# Patient Record
Sex: Male | Born: 1945
Health system: Southern US, Community
[De-identification: ages and names within clinical notes are randomized; demographics above are authoritative.]

## PROBLEM LIST (undated history)

## (undated) DIAGNOSIS — H409 Unspecified glaucoma: Secondary | ICD-10-CM

## (undated) DIAGNOSIS — I2510 Atherosclerotic heart disease of native coronary artery without angina pectoris: Secondary | ICD-10-CM

## (undated) DIAGNOSIS — I10 Essential (primary) hypertension: Secondary | ICD-10-CM

## (undated) DIAGNOSIS — R001 Bradycardia, unspecified: Secondary | ICD-10-CM

## (undated) DIAGNOSIS — K219 Gastro-esophageal reflux disease without esophagitis: Secondary | ICD-10-CM

## (undated) DIAGNOSIS — J61 Pneumoconiosis due to asbestos and other mineral fibers: Secondary | ICD-10-CM

## (undated) DIAGNOSIS — E785 Hyperlipidemia, unspecified: Secondary | ICD-10-CM

## (undated) HISTORY — DX: Bradycardia, unspecified: R00.1

## (undated) HISTORY — DX: Hyperlipidemia, unspecified: E78.5

## (undated) HISTORY — DX: Atherosclerotic heart disease of native coronary artery without angina pectoris: I25.10

## (undated) HISTORY — DX: Gastro-esophageal reflux disease without esophagitis: K21.9

## (undated) HISTORY — DX: Pneumoconiosis due to asbestos and other mineral fibers: J61

---

## 1998-05-02 ENCOUNTER — Ambulatory Visit (HOSPITAL_COMMUNITY): Admission: RE | Admit: 1998-05-02 | Discharge: 1998-05-02 | Payer: Self-pay | Admitting: Gastroenterology

## 1999-02-05 ENCOUNTER — Encounter: Admission: RE | Admit: 1999-02-05 | Discharge: 1999-02-05 | Payer: Self-pay | Admitting: *Deleted

## 1999-02-05 ENCOUNTER — Encounter: Payer: Self-pay | Admitting: *Deleted

## 2001-01-12 ENCOUNTER — Encounter: Payer: Self-pay | Admitting: *Deleted

## 2001-01-12 ENCOUNTER — Encounter: Admission: RE | Admit: 2001-01-12 | Discharge: 2001-01-12 | Payer: Self-pay | Admitting: *Deleted

## 2003-10-11 ENCOUNTER — Encounter (INDEPENDENT_AMBULATORY_CARE_PROVIDER_SITE_OTHER): Payer: Self-pay | Admitting: *Deleted

## 2003-10-11 ENCOUNTER — Ambulatory Visit (HOSPITAL_COMMUNITY): Admission: RE | Admit: 2003-10-11 | Discharge: 2003-10-11 | Payer: Self-pay | Admitting: Gastroenterology

## 2007-03-13 ENCOUNTER — Encounter: Admission: RE | Admit: 2007-03-13 | Discharge: 2007-03-13 | Payer: Self-pay | Admitting: Family Medicine

## 2007-03-14 ENCOUNTER — Ambulatory Visit (HOSPITAL_COMMUNITY): Admission: RE | Admit: 2007-03-14 | Discharge: 2007-03-14 | Payer: Self-pay | Admitting: Family Medicine

## 2008-06-24 ENCOUNTER — Ambulatory Visit: Payer: Self-pay | Admitting: Internal Medicine

## 2008-06-24 DIAGNOSIS — Z77090 Contact with and (suspected) exposure to asbestos: Secondary | ICD-10-CM | POA: Insufficient documentation

## 2008-06-24 DIAGNOSIS — I10 Essential (primary) hypertension: Secondary | ICD-10-CM

## 2008-09-17 ENCOUNTER — Encounter: Payer: Self-pay | Admitting: Internal Medicine

## 2008-09-17 ENCOUNTER — Ambulatory Visit: Payer: Self-pay | Admitting: Internal Medicine

## 2009-02-21 ENCOUNTER — Telehealth (INDEPENDENT_AMBULATORY_CARE_PROVIDER_SITE_OTHER): Payer: Self-pay | Admitting: *Deleted

## 2010-08-07 NOTE — Op Note (Signed)
NAME:  Caleb Anderson, Caleb Anderson                        ACCOUNT NO.:  [id number]   MEDICAL RECORD NO.:  [id number]                   PATIENT TYPE:  AMB   LOCATION:  ENDO                                 FACILITY:  [facility]   PHYSICIAN:  Petra Kuba, M.D.                 DATE OF BIRTH:  [birth year]   DATE OF PROCEDURE:  10/11/2003  DATE OF DISCHARGE:                                 OPERATIVE REPORT   PROCEDURE:  Esophagogastroduodenoscopy.   INDICATIONS FOR PROCEDURE:  Upper tract symptoms.  Want to proceed with a  one time endoscopy to rule out any significant abnormality.   Consent was signed after risks, benefits, methods, and options were  thoroughly discussed in the past.   MEDICINES USED ADDITIONALLY:  One of Versed only.   DESCRIPTION OF PROCEDURE:  The video endoscope was inserted by direct  vision, esophagus was normal, scope passed through a small hiatal hernia  into the stomach, advanced through a normal antrum, normal pylorus into a  normal duodenal bulb and around the C loop to a normal second portion of the  duodenum.  The scope was withdrawn back to the bulb and a good look there  ruled out abnormalities in that location. The scope was withdrawn back to  the stomach and retroflexed. High in the cardia, the hiatal hernia was  confirmed. The fundus, angularis, lesser and greater curve were normal on  retroflexed visualization. Straight visualization of the stomach did not  reveal any additional findings, air and water were suctioned, scope removed.  Again a good look at the esophagus was normal.  The patient tolerated the  procedure well. There was no obvious or immediate complication.   ENDOSCOPIC DIAGNOSIS:  1. Small hiatal hernia.  2. Otherwise normal EGD.   PLAN:  Continue pump inhibitors.  May want to try to wean them and use them  every other day.  He can go back to them p.r.n.  Happy to see back p.r.n.  Might want to consider a one time abdominal ultrasound for his GI  symptoms  otherwise return care to Dr. Arvilla Market for the customary health care  maintenance to include yearly rectals and guaiacs.                                               Petra Kuba, M.D.    MEM/MEDQ  D:  10/11/2003  T:  10/11/2003  Job:  [id number]   cc:   Donia Guiles, M.D.  [address] [place]  [place] [address]  Fax: [phone]

## 2010-08-07 NOTE — Op Note (Signed)
NAME:  Caleb Anderson, Caleb Anderson                        ACCOUNT NO.:  [id number]   MEDICAL RECORD NO.:  [id number]                   PATIENT TYPE:  AMB   LOCATION:  ENDO                                 FACILITY:  [facility]   PHYSICIAN:  Petra Kuba, M.D.                 DATE OF BIRTH:  Oct 19, 1945   DATE OF PROCEDURE:  10/11/2003  DATE OF DISCHARGE:                                 OPERATIVE REPORT   PROCEDURE:  Colonoscopy with polypectomy.   INDICATIONS:  Patient with a history of colon polyps.  Due for a repeat  screening.   Consent was signed after risks, benefits, methods, options thoroughly  discussed in the office in the past.   MEDICINES USED:  Demerol 50, Versed 5.   PROCEDURE:  Rectal inspection is pertinent for external hemorrhoids, small.  Digital exam was negative.  The video regular colonoscope was inserted and  easily advanced around the colon to the cecum.  This did not require any  abdominal pressure or any position changes.  No obvious abnormality was seen  on insertion.  The cecum was identified by the appendiceal orifice and  ileocecal valve.  The scope was inserted shortways into the terminal ileum,  which was normal.  Photo documentation was obtained.  The scope was slowly  withdrawn.  On slow withdrawal through the colon, the prep was adequate.  There was some liquid stool that required washing and suction.  The cecum,  ascending, transverse, and the majority of the descending were normal.  As  the scope was withdrawn around the sigmoid, a few diverticula were seen.  There were also very tiny distal sigmoid, probably hyperplastic-appearing  polyps, which were all hot biopsied x1 and put in the first container.  In  the rectum was a small polyp, which was snared and electrocautery applied.  The polyp was suctioned through the scope and collected in the trap.  This  was put in a second container.  Anorectal pull-through and retroflexion  confirms some small hemorrhoids.   The scope was reinserted shortways up the  left side of the colon.  Air was suctioned.  Scope removed.  The patient  tolerated the procedure well.  There was no obvious immediate complication.   ENDOSCOPIC DIAGNOSES:  1. Internal/external hemorrhoids.  2. Occasional sigmoid diverticula.  3. Three tiny sigmoid, probably hyperplastic-appearing polyps, hot biopsied.  4. One small rectal polyp, snared.  5. Otherwise within normal limits to the terminal ileum.   PLAN:  Await pathology.  Probably repeat colon screening in five years.  Continue workup with the one-time EGD.                                               Petra Kuba, M.D.    MEM/MEDQ  D:  10/11/2003  T:  10/11/2003  Job:  [id number]   cc:   Donia Guiles, M.D.  [address] [place]  [place] [address]  Fax: [phone]

## 2011-10-06 ENCOUNTER — Other Ambulatory Visit: Payer: Self-pay | Admitting: Cardiology

## 2011-10-06 ENCOUNTER — Encounter: Payer: Self-pay | Admitting: *Deleted

## 2011-10-06 DIAGNOSIS — R079 Chest pain, unspecified: Secondary | ICD-10-CM

## 2011-10-27 ENCOUNTER — Ambulatory Visit (HOSPITAL_COMMUNITY)
Admission: RE | Admit: 2011-10-27 | Discharge: 2011-10-27 | Disposition: A | Discharge status: Home / Self Care | Payer: BC Managed Care – PPO | Source: Ambulatory Visit | Attending: Cardiology | Admitting: Cardiology

## 2011-10-27 ENCOUNTER — Encounter (HOSPITAL_COMMUNITY): Payer: Self-pay

## 2011-10-27 DIAGNOSIS — K449 Diaphragmatic hernia without obstruction or gangrene: Secondary | ICD-10-CM | POA: Insufficient documentation

## 2011-10-27 DIAGNOSIS — R079 Chest pain, unspecified: Secondary | ICD-10-CM | POA: Insufficient documentation

## 2011-10-27 DIAGNOSIS — J984 Other disorders of lung: Secondary | ICD-10-CM | POA: Insufficient documentation

## 2011-10-27 DIAGNOSIS — I2510 Atherosclerotic heart disease of native coronary artery without angina pectoris: Secondary | ICD-10-CM | POA: Insufficient documentation

## 2011-10-27 HISTORY — DX: Essential (primary) hypertension: I10

## 2011-10-27 HISTORY — DX: Unspecified glaucoma: H40.9

## 2011-10-27 MED ORDER — IOHEXOL 350 MG/ML SOLN
80.0000 mL | Freq: Once | INTRAVENOUS | Status: AC | PRN
  Administered 2011-10-27: 80 mL via INTRAVENOUS

## 2011-10-27 MED ORDER — NITROGLYCERIN 0.4 MG SL SUBL
SUBLINGUAL_TABLET | SUBLINGUAL | Status: AC
  Filled 2011-10-27: qty 25

## 2012-02-14 DIAGNOSIS — H409 Unspecified glaucoma: Secondary | ICD-10-CM | POA: Diagnosis not present

## 2012-02-14 DIAGNOSIS — E669 Obesity, unspecified: Secondary | ICD-10-CM | POA: Diagnosis not present

## 2012-02-14 DIAGNOSIS — Z23 Encounter for immunization: Secondary | ICD-10-CM | POA: Diagnosis not present

## 2012-02-14 DIAGNOSIS — I10 Essential (primary) hypertension: Secondary | ICD-10-CM | POA: Diagnosis not present

## 2012-02-14 DIAGNOSIS — I2510 Atherosclerotic heart disease of native coronary artery without angina pectoris: Secondary | ICD-10-CM | POA: Diagnosis not present

## 2012-02-14 DIAGNOSIS — E782 Mixed hyperlipidemia: Secondary | ICD-10-CM | POA: Diagnosis not present

## 2012-04-25 DIAGNOSIS — E782 Mixed hyperlipidemia: Secondary | ICD-10-CM | POA: Diagnosis not present

## 2012-07-24 DIAGNOSIS — Z79899 Other long term (current) drug therapy: Secondary | ICD-10-CM | POA: Diagnosis not present

## 2012-07-24 DIAGNOSIS — E782 Mixed hyperlipidemia: Secondary | ICD-10-CM | POA: Diagnosis not present

## 2012-08-15 DIAGNOSIS — H409 Unspecified glaucoma: Secondary | ICD-10-CM | POA: Diagnosis not present

## 2012-08-15 DIAGNOSIS — H4011X0 Primary open-angle glaucoma, stage unspecified: Secondary | ICD-10-CM | POA: Diagnosis not present

## 2012-08-17 DIAGNOSIS — Z87891 Personal history of nicotine dependence: Secondary | ICD-10-CM | POA: Diagnosis not present

## 2012-08-17 DIAGNOSIS — Z6832 Body mass index (BMI) 32.0-32.9, adult: Secondary | ICD-10-CM | POA: Diagnosis not present

## 2012-08-17 DIAGNOSIS — E782 Mixed hyperlipidemia: Secondary | ICD-10-CM | POA: Diagnosis not present

## 2012-08-17 DIAGNOSIS — I2510 Atherosclerotic heart disease of native coronary artery without angina pectoris: Secondary | ICD-10-CM | POA: Diagnosis not present

## 2012-08-17 DIAGNOSIS — Z23 Encounter for immunization: Secondary | ICD-10-CM | POA: Diagnosis not present

## 2012-08-17 DIAGNOSIS — I119 Hypertensive heart disease without heart failure: Secondary | ICD-10-CM | POA: Diagnosis not present

## 2012-08-17 DIAGNOSIS — E669 Obesity, unspecified: Secondary | ICD-10-CM | POA: Diagnosis not present

## 2012-08-17 DIAGNOSIS — Z0000 Encounter for general adult medical examination without abnormal findings: Secondary | ICD-10-CM | POA: Diagnosis not present

## 2012-08-30 DIAGNOSIS — I2510 Atherosclerotic heart disease of native coronary artery without angina pectoris: Secondary | ICD-10-CM | POA: Diagnosis not present

## 2012-08-30 DIAGNOSIS — E782 Mixed hyperlipidemia: Secondary | ICD-10-CM | POA: Diagnosis not present

## 2012-08-30 DIAGNOSIS — I10 Essential (primary) hypertension: Secondary | ICD-10-CM | POA: Diagnosis not present

## 2012-09-29 DIAGNOSIS — Z87891 Personal history of nicotine dependence: Secondary | ICD-10-CM | POA: Diagnosis not present

## 2013-01-10 DIAGNOSIS — Z23 Encounter for immunization: Secondary | ICD-10-CM | POA: Diagnosis not present

## 2013-02-07 DIAGNOSIS — H4011X0 Primary open-angle glaucoma, stage unspecified: Secondary | ICD-10-CM | POA: Diagnosis not present

## 2013-02-07 DIAGNOSIS — H409 Unspecified glaucoma: Secondary | ICD-10-CM | POA: Diagnosis not present

## 2013-03-21 ENCOUNTER — Other Ambulatory Visit: Payer: Self-pay

## 2013-04-11 DIAGNOSIS — H4011X0 Primary open-angle glaucoma, stage unspecified: Secondary | ICD-10-CM | POA: Diagnosis not present

## 2013-04-11 DIAGNOSIS — H409 Unspecified glaucoma: Secondary | ICD-10-CM | POA: Diagnosis not present

## 2013-05-04 ENCOUNTER — Encounter: Payer: Self-pay | Admitting: *Deleted

## 2013-05-04 DIAGNOSIS — E7800 Pure hypercholesterolemia, unspecified: Secondary | ICD-10-CM | POA: Insufficient documentation

## 2013-05-04 DIAGNOSIS — I25810 Atherosclerosis of coronary artery bypass graft(s) without angina pectoris: Secondary | ICD-10-CM

## 2013-08-08 ENCOUNTER — Encounter: Payer: Self-pay | Admitting: Cardiology

## 2013-08-21 ENCOUNTER — Ambulatory Visit (INDEPENDENT_AMBULATORY_CARE_PROVIDER_SITE_OTHER): Payer: Medicare Other | Admitting: Cardiology

## 2013-08-21 ENCOUNTER — Encounter: Payer: Self-pay | Admitting: Cardiology

## 2013-08-21 VITALS — BP 150/86 | HR 53 | Ht 66.0 in | Wt 195.0 lb

## 2013-08-21 DIAGNOSIS — I2510 Atherosclerotic heart disease of native coronary artery without angina pectoris: Secondary | ICD-10-CM | POA: Diagnosis not present

## 2013-08-21 DIAGNOSIS — I25810 Atherosclerosis of coronary artery bypass graft(s) without angina pectoris: Secondary | ICD-10-CM | POA: Diagnosis not present

## 2013-08-21 DIAGNOSIS — E7800 Pure hypercholesterolemia, unspecified: Secondary | ICD-10-CM

## 2013-08-21 DIAGNOSIS — I10 Essential (primary) hypertension: Secondary | ICD-10-CM

## 2013-08-21 LAB — BASIC METABOLIC PANEL
BUN: 22 mg/dL (ref 6–23)
CO2: 27 meq/L (ref 19–32)
Calcium: 9 mg/dL (ref 8.4–10.5)
Chloride: 103 mEq/L (ref 96–112)
Creatinine, Ser: 1.2 mg/dL (ref 0.4–1.5)
GFR: 63.05 mL/min (ref >60.00)
GLUCOSE: 108 mg/dL — AB (ref 70–99)
POTASSIUM: 4.3 meq/L (ref 3.5–5.1)
Sodium: 137 mEq/L (ref 135–145)

## 2013-08-21 LAB — ALT: ALT: 21 U/L (ref 0–53)

## 2013-08-21 LAB — LIPID PANEL
CHOL/HDL RATIO: 7
Cholesterol: 287 mg/dL — ABNORMAL HIGH (ref 0–200)
HDL: 42.5 mg/dL (ref >39.00)
LDL Cholesterol: 194 mg/dL — ABNORMAL HIGH (ref 0–99)
Triglycerides: 255 mg/dL — ABNORMAL HIGH (ref 0.0–149.0)
VLDL: 51 mg/dL — AB (ref 0.0–40.0)

## 2013-08-21 NOTE — Patient Instructions (Signed)
Your physician has requested that you have en exercise stress myoview. For further information please visit [email or web address]. Please follow instruction sheet, as given.  Your physician recommends that you return for lab work TODAY (BMET, LIPID PROFILE, ALT)  Your physician recommends that you continue on your current medications as directed. Please refer to the Current Medication list given to you today.  Your physician wants you to follow-up in: [place]. You will receive a reminder letter in the mail two months in advance. If you don't receive a letter, please call our office to schedule the follow-up appointment.

## 2013-08-21 NOTE — Progress Notes (Signed)
[address] [place], [place]  [address] Phone: [phone] Fax:  [phone]  Date:  08/21/2013   ID:  Caleb Anderson, DOB 1945/12/30, MRN [id number]  PCP:  Mayra Neer, MD  Cardiologist:  Fransico Him, MD     History of Present Illness: Caleb Anderson is a 68 y.o. male with a history of HTN, dyslipidemia and coronary artery calcifications on chest CT and no ischemia on nuclear stress test in 2013 who presents today for annual followup.  He is doing well.  He denies any chest pain, SOB, DOE, LE edema, dizziness, palpitations or syncope.  He does not get any formal exercise but walks around 1 mile daily.   Wt Readings from Last 3 Encounters:  08/21/13 195 lb (88.451 kg)  06/24/08 203 lb (92.08 kg)     Past Medical History  Diagnosis Date  . Glaucoma   . GERD (gastroesophageal reflux disease)   . Asbestosis   . Hypertension   . Hyperlipidemia   . Coronary artery disease     coronary artery calcifications by chest CT with normal nuclear stress test 2013    Current Outpatient Prescriptions  Medication Sig Dispense Refill  . aspirin 81 MG tablet Take 81 mg by mouth daily.      . Coenzyme Q10 (CO Q 10 PO) Take 200 mg by mouth.      . Dorzolamide HCl-Timolol Mal PF 22.3-6.8 MG/ML SOLN Apply to eye.      . latanoprost (XALATAN) 0.005 % ophthalmic solution Place 1 drop into both eyes at bedtime.       Marland Kitchen lisinopril-hydrochlorothiazide (PRINZIDE,ZESTORETIC) 20-12.5 MG per tablet Take 1 tablet by mouth daily.      . naproxen sodium (ANAPROX) 220 MG tablet Take 220 mg by mouth 2 (two) times daily with a meal.      . omeprazole (PRILOSEC) 20 MG capsule Take 20 mg by mouth daily.       No current facility-administered medications for this visit.    Allergies:    Allergies  Allergen Reactions  . Fish Oil     Weight gain   . Statins     (Lipitor, crestor, simvastatin, and pravastatin)  . Trilipix [Choline Fenofibrate]   . Welchol [Colesevelam Hcl]     Aches   . Zetia [Ezetimibe]     Social History:  The patient  reports that he has quit smoking. He has never used smokeless tobacco. He reports that he drinks alcohol. He reports that he does not use illicit drugs.   Family History:  The patient's family history includes COPD in his mother and sister; Heart attack in his brother; Heart disease in his brother; Kidney failure in his father.   ROS:  Please see the history of present illness.      All other systems reviewed and negative.   PHYSICAL EXAM: VS:  BP 150/86  Pulse 53  Ht 5\' 6"  (1.676 m)  Wt 195 lb (88.451 kg)  BMI 31.49 kg/m2 Well nourished, well developed, in no acute distress HEENT: normal Neck: no JVD Cardiac:  normal S1, S2; RRR; no murmur Lungs:  clear to auscultation bilaterally, no wheezing, rhonchi or rales Abd: soft, nontender, no hepatomegaly Ext: no edema Skin: warm and dry Neuro:  CNs 2-12 intact, no focal abnormalities noted  EKG:     Sinus bradycardia at 53bpm with no ST changes  ASSESSMENT AND PLAN:  1. ASCAD with coronary artery calcifications on chest CT - continue ASA -  check Stress myoview to rule out ischemia since last nuclear stress test was 2 years ago 2. HTN well controlled - continue Lisinopril HCT - check BMET 3. Dyslipidemia - he did not tolerate the pravastatin due to muscle aches - check fasting lipid and ALT  Followup with me in 1 year  Signed, Fransico Him, MD 08/21/2013 9:20 AM

## 2013-08-29 ENCOUNTER — Encounter: Payer: Self-pay | Admitting: Cardiology

## 2013-08-29 ENCOUNTER — Ambulatory Visit: Payer: BC Managed Care – PPO | Admitting: Cardiology

## 2013-09-04 DIAGNOSIS — I119 Hypertensive heart disease without heart failure: Secondary | ICD-10-CM | POA: Diagnosis not present

## 2013-09-04 DIAGNOSIS — N529 Male erectile dysfunction, unspecified: Secondary | ICD-10-CM | POA: Diagnosis not present

## 2013-09-04 DIAGNOSIS — Z23 Encounter for immunization: Secondary | ICD-10-CM | POA: Diagnosis not present

## 2013-09-04 DIAGNOSIS — Z0000 Encounter for general adult medical examination without abnormal findings: Secondary | ICD-10-CM | POA: Diagnosis not present

## 2013-09-04 DIAGNOSIS — Z6832 Body mass index (BMI) 32.0-32.9, adult: Secondary | ICD-10-CM | POA: Diagnosis not present

## 2013-09-04 DIAGNOSIS — Z125 Encounter for screening for malignant neoplasm of prostate: Secondary | ICD-10-CM | POA: Diagnosis not present

## 2013-09-04 DIAGNOSIS — I2510 Atherosclerotic heart disease of native coronary artery without angina pectoris: Secondary | ICD-10-CM | POA: Diagnosis not present

## 2013-09-04 DIAGNOSIS — E782 Mixed hyperlipidemia: Secondary | ICD-10-CM | POA: Diagnosis not present

## 2013-09-04 DIAGNOSIS — E669 Obesity, unspecified: Secondary | ICD-10-CM | POA: Diagnosis not present

## 2013-09-05 ENCOUNTER — Ambulatory Visit (HOSPITAL_COMMUNITY): Payer: Medicare Other | Attending: Cardiology | Admitting: Radiology

## 2013-09-05 VITALS — BP 134/74 | HR 50 | Ht 66.0 in | Wt 193.0 lb

## 2013-09-05 DIAGNOSIS — R079 Chest pain, unspecified: Secondary | ICD-10-CM | POA: Insufficient documentation

## 2013-09-05 DIAGNOSIS — I10 Essential (primary) hypertension: Secondary | ICD-10-CM | POA: Insufficient documentation

## 2013-09-05 DIAGNOSIS — E7800 Pure hypercholesterolemia, unspecified: Secondary | ICD-10-CM

## 2013-09-05 DIAGNOSIS — Z87891 Personal history of nicotine dependence: Secondary | ICD-10-CM | POA: Insufficient documentation

## 2013-09-05 DIAGNOSIS — I25810 Atherosclerosis of coronary artery bypass graft(s) without angina pectoris: Secondary | ICD-10-CM

## 2013-09-05 DIAGNOSIS — Z8249 Family history of ischemic heart disease and other diseases of the circulatory system: Secondary | ICD-10-CM | POA: Diagnosis not present

## 2013-09-05 DIAGNOSIS — I2510 Atherosclerotic heart disease of native coronary artery without angina pectoris: Secondary | ICD-10-CM

## 2013-09-05 MED ORDER — TECHNETIUM TC 99M SESTAMIBI GENERIC - CARDIOLITE
30.0000 | Freq: Once | INTRAVENOUS | Status: AC | PRN
  Administered 2013-09-05: 30 via INTRAVENOUS

## 2013-09-05 MED ORDER — TECHNETIUM TC 99M SESTAMIBI GENERIC - CARDIOLITE
10.0000 | Freq: Once | INTRAVENOUS | Status: AC | PRN
  Administered 2013-09-05: 10 via INTRAVENOUS

## 2013-09-05 NOTE — Progress Notes (Signed)
[address] [place], [place] [address] [phone]    Cardiology Nuclear Med Study  Caleb Anderson is a 68 y.o. male     MRN : [id number]     DOB: 1945-10-16  Procedure Date: 09/05/2013  Nuclear Med Background Indication for Stress Test:  Evaluation for Ischemia History:  No known CAD, MPI 2013 (normal) EF 68%, Cardiac CT 2013 +coronary calc. Cardiac Risk Factors: Family History - CAD, History of Smoking, Hypertension and Lipids  Symptoms:  Chest Pain   Nuclear Pre-Procedure Caffeine/Decaff Intake:  None> 12 hrs NPO After: 8:00pm   Lungs:  clear O2 Sat: 97% on room air. IV 0.9% NS with Angio Cath:  22g  IV Site: R Antecubital x 1, tolerated well IV Started by:  Irven Baltimore, RN  Chest Size (in):  44 Cup Size: n/a  Height: 5\' 6"  (1.676 m)  Weight:  193 lb (87.544 kg)  BMI:  Body mass index is 31.17 kg/(m^2). Tech Comments:  Lisinopril this am. Irven Baltimore, RN.    Nuclear Med Study 1 or 2 day study: 1 day  Stress Test Type:  Stress  Reading MD: N/A  Order Authorizing Provider:  Fransico Him, MD  Resting Radionuclide: Technetium 50m Sestamibi  Resting Radionuclide Dose: 11.0 mCi   Stress Radionuclide:  Technetium 67m Sestamibi  Stress Radionuclide Dose: 33.0 mCi           Stress Protocol Rest HR: 50 Stress HR: 136  Rest BP: 134/74 Stress BP: 141/64  Exercise Time (min): 9:45 METS: 10.1           Dose of Adenosine (mg):  n/a Dose of Lexiscan: n/a mg  Dose of Atropine (mg): n/a Dose of Dobutamine: n/a mcg/kg/min (at max HR)  Stress Test Technologist: Glade Lloyd, BS-ES  Nuclear Technologist:  Charlton Amor, CNMT     Rest Procedure:  Myocardial perfusion imaging was performed at rest 45 minutes following the intravenous administration of Technetium 94m Sestamibi. Rest ECG: NSR - iRRR  Stress Procedure:  The patient exercised on the treadmill utilizing the Bruce Protocol for 9:45 minutes. The patient stopped due to  fatigue, SOB and denied any chest pain.  Technetium 82m Sestamibi was injected at peak exercise and myocardial perfusion imaging was performed after a brief delay.  Stress ECG: 1 mmdownsloaping STD in the inferolateral leads that resolve 1 minute into recovery  QPS Raw Data Images:  There is interference from nuclear activity from structures below the diaphragm. This does not affect the ability to read the study. Stress Images:  Normal homogeneous uptake in all areas of the myocardium. Rest Images:  Normal homogeneous uptake in all areas of the myocardium. Subtraction (SDS):  No evidence of ischemia. Transient Ischemic Dilatation (Normal <1.22):  0.93 Lung/Heart Ratio (Normal <0.45):  0.38  Quantitative Gated Spect Images QGS EDV:  103 ml QGS ESV:  43 ml  Impression Exercise Capacity:  Excellent exercise capacity. BP Response:  Normal blood pressure response. Clinical Symptoms:  There is dyspnea. ECG Impression:  STD at the stage 3 of Bruce protocol taht resolve 1 minute into recovery suggesting low specifity. Comparison with Prior Nuclear Study: No significant change from previous study  Overall Impression:  Normal stress nuclear study.  LV Ejection Fraction: 59%.  LV Wall Motion:  NL LV Function; NL Wall Motion  Dorothy Spark 09/05/2013

## 2013-09-17 ENCOUNTER — Telehealth: Payer: Self-pay | Admitting: Cardiology

## 2013-09-17 NOTE — Telephone Encounter (Signed)
Patient would like test results, please call and advise.

## 2013-09-18 NOTE — Telephone Encounter (Signed)
Dr Radford Pax, Pt wanted results of Stress Myoview, but I do not see results to give. Can you advise.

## 2013-09-18 NOTE — Telephone Encounter (Signed)
Made pt aware of results

## 2013-09-18 NOTE — Telephone Encounter (Signed)
Normal stress test

## 2013-09-18 NOTE — Telephone Encounter (Signed)
lmtrc

## 2013-10-05 ENCOUNTER — Telehealth: Payer: Self-pay | Admitting: *Deleted

## 2013-10-05 NOTE — Telephone Encounter (Signed)
Opened in error

## 2013-11-01 DIAGNOSIS — H4011X0 Primary open-angle glaucoma, stage unspecified: Secondary | ICD-10-CM | POA: Diagnosis not present

## 2013-11-01 DIAGNOSIS — H409 Unspecified glaucoma: Secondary | ICD-10-CM | POA: Diagnosis not present

## 2014-01-08 DIAGNOSIS — Z23 Encounter for immunization: Secondary | ICD-10-CM | POA: Diagnosis not present

## 2014-05-09 DIAGNOSIS — H4011X1 Primary open-angle glaucoma, mild stage: Secondary | ICD-10-CM | POA: Diagnosis not present

## 2014-05-30 ENCOUNTER — Encounter: Payer: Self-pay | Admitting: Cardiology

## 2014-06-05 ENCOUNTER — Encounter: Payer: Self-pay | Admitting: *Deleted

## 2014-06-27 ENCOUNTER — Encounter: Payer: Self-pay | Admitting: *Deleted

## 2014-06-27 NOTE — Progress Notes (Signed)
Patient ID: Caleb Anderson, male   DOB: 1945/05/01, 69 y.o.   MRN: [id number] [facility] II Informed Consent   Subject Name:  [name] Subject met inclusion and exclusion criteria. The informed consent form, study requirements and expectations were reviewed with the subject and questions and concerns were addressed prior to the signing of the consent form. The subject verbalized understanding of the trail requirements. The subject agreed to participate in the [facility] II trial and signed the informed consent. The informed consent was obtained prior to performance of any protocol-specific procedures for the subject. A copy of the signed informed consent was given to the subject and a copy was placed in the subject's medical record.   Jake Bathe, RN 01/29/2014

## 2014-07-29 DIAGNOSIS — H4011X1 Primary open-angle glaucoma, mild stage: Secondary | ICD-10-CM | POA: Diagnosis not present

## 2014-07-31 ENCOUNTER — Encounter: Payer: Self-pay | Admitting: Cardiology

## 2014-07-31 DIAGNOSIS — H4011X1 Primary open-angle glaucoma, mild stage: Secondary | ICD-10-CM | POA: Diagnosis not present

## 2014-08-12 ENCOUNTER — Encounter: Payer: Self-pay | Admitting: Cardiology

## 2014-08-27 ENCOUNTER — Ambulatory Visit: Payer: PRIVATE HEALTH INSURANCE | Admitting: Cardiology

## 2014-09-17 DIAGNOSIS — N529 Male erectile dysfunction, unspecified: Secondary | ICD-10-CM | POA: Diagnosis not present

## 2014-09-17 DIAGNOSIS — Z0000 Encounter for general adult medical examination without abnormal findings: Secondary | ICD-10-CM | POA: Diagnosis not present

## 2014-09-17 DIAGNOSIS — K219 Gastro-esophageal reflux disease without esophagitis: Secondary | ICD-10-CM | POA: Diagnosis not present

## 2014-09-17 DIAGNOSIS — H409 Unspecified glaucoma: Secondary | ICD-10-CM | POA: Diagnosis not present

## 2014-09-17 DIAGNOSIS — E782 Mixed hyperlipidemia: Secondary | ICD-10-CM | POA: Diagnosis not present

## 2014-09-17 DIAGNOSIS — I2510 Atherosclerotic heart disease of native coronary artery without angina pectoris: Secondary | ICD-10-CM | POA: Diagnosis not present

## 2014-09-17 DIAGNOSIS — Z6833 Body mass index (BMI) 33.0-33.9, adult: Secondary | ICD-10-CM | POA: Diagnosis not present

## 2014-09-17 DIAGNOSIS — Z125 Encounter for screening for malignant neoplasm of prostate: Secondary | ICD-10-CM | POA: Diagnosis not present

## 2014-09-17 DIAGNOSIS — I119 Hypertensive heart disease without heart failure: Secondary | ICD-10-CM | POA: Diagnosis not present

## 2014-09-17 DIAGNOSIS — E669 Obesity, unspecified: Secondary | ICD-10-CM | POA: Diagnosis not present

## 2014-09-17 DIAGNOSIS — Z1211 Encounter for screening for malignant neoplasm of colon: Secondary | ICD-10-CM | POA: Diagnosis not present

## 2014-09-18 ENCOUNTER — Encounter: Payer: Self-pay | Admitting: Cardiology

## 2014-10-20 DIAGNOSIS — I2510 Atherosclerotic heart disease of native coronary artery without angina pectoris: Secondary | ICD-10-CM | POA: Insufficient documentation

## 2014-10-20 NOTE — Progress Notes (Signed)
Cardiology Office Note   Date:  10/21/2014   ID:  Caleb Anderson, DOB [birth year], MRN [id number]  PCP:  Mayra Neer, MD    Chief Complaint  Patient presents with  . Follow-up    CAD      History of Present Illness: Caleb Anderson is a 69 y.o. male with a history of HTN, dyslipidemia and coronary artery calcifications on chest CT and no ischemia on nuclear stress test in 2013 who presents today for annual followup. He is doing well. He denies any chest pain, SOB, DOE, LE edema, dizziness, palpitations or syncope. He does not get any formal exercise but walks around 1 mile daily and plays golf.     Past Medical History  Diagnosis Date  . Glaucoma   . GERD (gastroesophageal reflux disease)   . Asbestosis   . Hypertension   . Hyperlipidemia   . Coronary artery disease     coronary artery calcifications by chest CT with normal nuclear stress test 2013    History reviewed. No pertinent past surgical history.   Current Outpatient Prescriptions  Medication Sig Dispense Refill  . aspirin 81 MG tablet Take 81 mg by mouth daily.    . Dorzolamide HCl-Timolol Mal PF 22.3-6.8 MG/ML SOLN Apply to eye.    . Investigational - Study Medication Inject 150 mg into the skin every 14 (fourteen) days. bococizumab vs placebo    . latanoprost (XALATAN) 0.005 % ophthalmic solution Place 1 drop into both eyes at bedtime.     Marland Kitchen lisinopril-hydrochlorothiazide (PRINZIDE,ZESTORETIC) 20-12.5 MG per tablet Take 1 tablet by mouth daily.    . naproxen sodium (ANAPROX) 220 MG tablet Take 220 mg by mouth 2 (two) times daily with a meal.    . omeprazole (PRILOSEC) 20 MG capsule Take 20 mg by mouth daily.     No current facility-administered medications for this visit.    Allergies:   Fish oil; Statins; Trilipix; Welchol; and Zetia    Social History:  The patient  reports that he has quit smoking. He has never used smokeless tobacco. He reports that he drinks alcohol. He  reports that he does not use illicit drugs.   Family History:  The patient's family history includes COPD in his mother and sister; Heart attack in his brother; Heart disease in his brother; Kidney failure in his father.    ROS:  Please see the history of present illness.   Otherwise, review of systems are positive for none.   All other systems are reviewed and negative.    PHYSICAL EXAM: VS:  BP 132/68 mmHg  Pulse 56  Ht 5\' 6"  (1.676 m)  Wt 195 lb 1.9 oz (88.506 kg)  BMI 31.51 kg/m2 , BMI Body mass index is 31.51 kg/(m^2). GEN: Well nourished, well developed, in no acute distress HEENT: normal Neck: no JVD, carotid bruits, or masses Cardiac: RRR; no murmurs, rubs, or gallops,no edema  Respiratory:  clear to auscultation bilaterally, normal work of breathing GI: soft, nontender, nondistended, + BS MS: no deformity or atrophy Skin: warm and dry, no rash Neuro:  Strength and sensation are intact Psych: euthymic mood, full affect   EKG:  EKG is ordered today. The ekg ordered today demonstrates sinus bradycardia at 56 bpm with no ST changes   Recent Labs: No results found for requested labs within last 365 days.    Lipid Panel    Component  Value Date/Time   CHOL 287* 08/21/2013 0944   TRIG 255.0* 08/21/2013 0944   HDL 42.50 08/21/2013 0944   CHOLHDL 7 08/21/2013 0944   VLDL 51.0* 08/21/2013 0944   LDLCALC 194* 08/21/2013 0944      Wt Readings from Last 3 Encounters:  10/21/14 195 lb 1.9 oz (88.506 kg)  09/05/13 193 lb (87.544 kg)  08/21/13 195 lb (88.451 kg)     ASSESSMENT AND PLAN:  1. ASCAD with coronary artery calcifications on chest CT - no ischemia on nuclear stress test 2015 - continue ASA 2. HTN well controlled - continue Lisinopril HCT - I will get a copy of his BMET from PCP 3. Dyslipidemia - he did not tolerate the pravastatin due to muscle aches and is intolerant to all other statins.  He is currently in a lipid study    Current medicines are  reviewed at length with the patient today.  The patient does not have concerns regarding medicines.  The following changes have been made:  no change  Labs/ tests ordered today: See above Assessment and Plan No orders of the defined types were placed in this encounter.     Disposition:   FU with me in 1 year  Signed, Sueanne Margarita, MD  10/21/2014 3:55 PM    [address], [place], [place]  [address] Phone: [phone]; Fax: [phone]

## 2014-10-21 ENCOUNTER — Ambulatory Visit (INDEPENDENT_AMBULATORY_CARE_PROVIDER_SITE_OTHER): Payer: Medicare Other | Admitting: Cardiology

## 2014-10-21 ENCOUNTER — Encounter: Payer: Self-pay | Admitting: Cardiology

## 2014-10-21 VITALS — BP 132/68 | HR 56 | Ht 66.0 in | Wt 195.1 lb

## 2014-10-21 DIAGNOSIS — I2510 Atherosclerotic heart disease of native coronary artery without angina pectoris: Secondary | ICD-10-CM

## 2014-10-21 DIAGNOSIS — I10 Essential (primary) hypertension: Secondary | ICD-10-CM | POA: Diagnosis not present

## 2014-10-21 DIAGNOSIS — E7800 Pure hypercholesterolemia, unspecified: Secondary | ICD-10-CM

## 2014-10-21 DIAGNOSIS — I25700 Atherosclerosis of coronary artery bypass graft(s), unspecified, with unstable angina pectoris: Secondary | ICD-10-CM

## 2014-10-21 NOTE — Patient Instructions (Signed)

## 2014-12-03 ENCOUNTER — Encounter: Payer: Self-pay | Admitting: *Deleted

## 2014-12-03 DIAGNOSIS — Z006 Encounter for examination for normal comparison and control in clinical research program: Secondary | ICD-10-CM

## 2014-12-03 NOTE — Progress Notes (Signed)
Subject came in for Visit 11, first year visit.  Vital signs: Temp 98.1, BP 141/74, Resp18, Pulse 52. Subject had no complaints, has started working part time. EKG done, neuro and physical exam reviewed by Dr. Lia Foyer. Discussed with subject that study may end earlier than anticipated per phone call from sponsor last month. Subject stated that he had a physical exam recently with Dr. Arville Care.

## 2014-12-31 DIAGNOSIS — Z23 Encounter for immunization: Secondary | ICD-10-CM | POA: Diagnosis not present

## 2015-01-24 ENCOUNTER — Encounter: Payer: Self-pay | Admitting: *Deleted

## 2015-01-24 DIAGNOSIS — Z006 Encounter for examination for normal comparison and control in clinical research program: Secondary | ICD-10-CM

## 2015-01-24 NOTE — Progress Notes (Signed)
Informational telephone call to inform patient that the SPIRE I and SPIRE II Research study has come to a close and NOT to take any more injections of the investigational Product. Patient states her last dose was October 25th. Informed patient that we will be contacting patient next week to schedule End of study visit. Questions encouraged and answered.

## 2015-03-07 ENCOUNTER — Other Ambulatory Visit: Payer: Self-pay | Admitting: Gastroenterology

## 2015-03-07 DIAGNOSIS — D126 Benign neoplasm of colon, unspecified: Secondary | ICD-10-CM | POA: Diagnosis not present

## 2015-03-07 DIAGNOSIS — Z86010 Personal history of colonic polyps: Secondary | ICD-10-CM | POA: Diagnosis not present

## 2015-03-07 DIAGNOSIS — K5730 Diverticulosis of large intestine without perforation or abscess without bleeding: Secondary | ICD-10-CM | POA: Diagnosis not present

## 2015-03-07 DIAGNOSIS — D123 Benign neoplasm of transverse colon: Secondary | ICD-10-CM | POA: Diagnosis not present

## 2015-03-07 DIAGNOSIS — Z09 Encounter for follow-up examination after completed treatment for conditions other than malignant neoplasm: Secondary | ICD-10-CM | POA: Diagnosis not present

## 2015-03-11 ENCOUNTER — Encounter: Payer: Self-pay | Admitting: *Deleted

## 2015-03-11 ENCOUNTER — Other Ambulatory Visit: Payer: Self-pay | Admitting: *Deleted

## 2015-03-11 DIAGNOSIS — Z006 Encounter for examination for normal comparison and control in clinical research program: Secondary | ICD-10-CM

## 2015-03-11 NOTE — Progress Notes (Signed)
Final phone follow-up for the [facility] II research trial.  Patient has completed participation in the study.

## 2015-06-13 DIAGNOSIS — H401122 Primary open-angle glaucoma, left eye, moderate stage: Secondary | ICD-10-CM | POA: Diagnosis not present

## 2015-06-13 DIAGNOSIS — D2311 Other benign neoplasm of skin of right eyelid, including canthus: Secondary | ICD-10-CM | POA: Diagnosis not present

## 2015-06-13 DIAGNOSIS — H401111 Primary open-angle glaucoma, right eye, mild stage: Secondary | ICD-10-CM | POA: Diagnosis not present

## 2015-08-27 ENCOUNTER — Institutional Professional Consult (permissible substitution): Payer: Medicare Other | Admitting: Internal Medicine

## 2015-09-08 ENCOUNTER — Telehealth: Payer: Self-pay | Admitting: Internal Medicine

## 2015-09-08 NOTE — Telephone Encounter (Signed)
Patient calling to see if we have received records from Dr. Pearlie Oyster from [place].  He says that he has signed 2 releases from our office already.  Do not see the records scanned into patient's chart.  Magda Paganini - do you have these records?

## 2015-09-08 NOTE — Telephone Encounter (Signed)
I have not seen records nor will be reviewing them unless / until I see the patient as there is not doctor /pt relationship that I can see from [facility] as last encounter was 7 years ago so he would be considered a new pt)

## 2015-09-08 NOTE — Telephone Encounter (Signed)
Patient notified that records have been requested from Dr. Raul Del office. Nothing further needed.

## 2015-09-08 NOTE — Telephone Encounter (Signed)
I have not seen these Dr Melvyn Novas have you?

## 2015-09-08 NOTE — Telephone Encounter (Signed)
Patient scheduled for Consult with Dr. Melvyn Novas on 09/22/15.  Patient wants Dr. Melvyn Novas to have the records from Dr. Charlett Blake, his former Pulmonologist in  prior to his consult.  Patient states that he signed release for records.   Called Dr. Rolley Sims office and they informed me that the release that they received was from Dr. Raul Del office and they sent records to Dr. Brigitte Pulse. Called Dr. Raul Del office and they are faxing over copies of the records to [place] for his OV on 09/22/15  Attempted to contact patient to advise that this has been done, left message for him to call back. Will forward to [place] for follow up

## 2015-09-08 NOTE — Telephone Encounter (Signed)
Pt calling back states his cell service is bad up there [phone]

## 2015-09-22 ENCOUNTER — Encounter: Payer: Self-pay | Admitting: Internal Medicine

## 2015-09-22 ENCOUNTER — Ambulatory Visit (INDEPENDENT_AMBULATORY_CARE_PROVIDER_SITE_OTHER): Payer: Worker's Compensation | Admitting: Internal Medicine

## 2015-09-22 VITALS — BP 140/84 | HR 59 | Ht 66.0 in | Wt 197.0 lb

## 2015-09-22 DIAGNOSIS — R0789 Other chest pain: Secondary | ICD-10-CM

## 2015-09-22 DIAGNOSIS — R918 Other nonspecific abnormal finding of lung field: Secondary | ICD-10-CM

## 2015-09-22 NOTE — Progress Notes (Signed)
   Subjective:    Patient ID: Caleb Anderson, male    DOB: [birth year], 70 y.o.   MRN: [id number]  HPI    Review of Systems     Objective:   Physical Exam        Assessment & Plan:

## 2015-09-22 NOTE — Progress Notes (Signed)
Subjective:    Patient ID: Caleb Anderson, male    DOB: 1946-02-12,    MRN: [id number]  HPI  46 yowm  Quit smoking in 1992 doing serial f/u by Pearlie Oyster with MPN's in 07/24/15 (vs early pleural plaques) but no symptoms so referred to pulmonary clinic 09/22/2015 by Dr Arvil Chaco.   09/22/2015 1st [facility] office visit/ Wert   Chief Complaint  Patient presents with  . Pulmonary Consult    Referred by Dr. Serita Grammes for eval of abnormal ct chest. Pt denies any respiratory co's today. n   last known exposure to asbestos at [employer] in  1980's at [facility]  Cp's dating back at least 7 years always ant / typically on L above breast are lasting from a few minutes to up to an hour / not noted with breathing or coughing or exertion assoc with oc hb/indigestion, never supine  No obvious other patterns in day to day or daytime variabilty or assoc sob or chronic cough or cp or chest tightness, subjective wheeze overt sinus or hb symptoms. No unusual exp hx or h/o childhood pna/ asthma or knowledge of premature birth.  Sleeping ok without nocturnal  or early am exacerbation  of respiratory  c/o's or need for noct saba. Also denies any obvious fluctuation of symptoms with weather or environmental changes or other aggravating or alleviating factors except as outlined above   Current Medications, Allergies, Complete Past Medical History, Past Surgical History, Family History, and Social History were reviewed in [employer] record.             Review of Systems  Constitutional: Negative for fever, chills, activity change, appetite change and unexpected weight change.  HENT: Negative for congestion, dental problem, postnasal drip, rhinorrhea, sneezing, sore throat, trouble swallowing and voice change.   Eyes: Negative for visual disturbance.  Respiratory: Negative for cough, choking and shortness of breath.   Cardiovascular: Negative for chest pain and leg  swelling.  Gastrointestinal: Negative for nausea, vomiting and abdominal pain.       Indigestion  Genitourinary: Negative for difficulty urinating.  Musculoskeletal: Positive for arthralgias.  Skin: Negative for rash.  Psychiatric/Behavioral: Negative for behavioral problems and confusion.       Objective:   Physical Exam  Somber amb wm nad  Wt Readings from Last 3 Encounters:  09/22/15 197 lb (89.359 kg)  10/21/14 195 lb 1.9 oz (88.506 kg)  09/05/13 193 lb (87.544 kg)    Vital signs reviewed    HEENT: nl dentition, turbinates, and oropharynx. Nl external ear canals without cough reflex   NECK :  without JVD/Nodes/TM/ nl carotid upstrokes bilaterally   LUNGS: no acc muscle use,  Nl contour chest which is clear to A and P bilaterally without cough on insp or exp maneuvers   CV:  RRR  no s3 or murmur or increase in P2, no edema   ABD:  soft and nontender with nl inspiratory excursion in the supine position. No bruits or organomegaly, bowel sounds nl  MS:  Nl gait/ ext warm without deformities, calf tenderness, cyanosis or clubbing No obvious joint restrictions   SKIN: warm and dry without lesions    NEURO:  alert, approp, nl sensorium with  no motor deficits     Last CT Chest [facility]   07/24/15   MPNs vs early plaques largest 7.3 mm all along interlobular fissures, all on R      Assessment & Plan:

## 2015-09-22 NOTE — Patient Instructions (Signed)
Please see patient coordinator before you leave today  to schedule CT chest 10/24/15   You will need to return 1st week in May 2018 for full pfts

## 2015-09-24 DIAGNOSIS — R0789 Other chest pain: Secondary | ICD-10-CM | POA: Insufficient documentation

## 2015-09-24 NOTE — Assessment & Plan Note (Signed)
Quit asbestos exp [id number] Quit smoking 1992 CT Chest [facility]  07/24/15   MPNs vs early plaques largest 7.3 mm all along interlobular fissures, all on R> 8/4/417 repeat HRCT   Discussed in detail all the  indications, usual  risks and alternatives  relative to the benefits with patient who agrees to proceed with conservative fwith HRCT to answer question of ILD related to asbestos and f/u on nodules, which are probably early plaques.  Total time devoted to counseling  = 35/41m review case with pt/ discussion of options/alternatives/ personally creating written instructions  in presence of pt  then going over those specific  Instructions directly with the pt including how to use all of the meds but in particular covering each new medication in detail and the difference between the maintenance/automatic meds and the prns using an action plan format for the latter.

## 2015-09-24 NOTE — Assessment & Plan Note (Signed)
Onset at least 2010 never supine, ex or pleuritic/ fleeting  Pain has not changed x 7 years in pattern or severity or frequency and is likely ibs vs gerd/dyspepsia, advised re diet/ otc's prn

## 2015-10-13 ENCOUNTER — Encounter: Payer: Self-pay | Admitting: Internal Medicine

## 2015-10-23 ENCOUNTER — Telehealth: Payer: Self-pay | Admitting: Internal Medicine

## 2015-10-23 NOTE — Telephone Encounter (Signed)
I checked his office and lookat and do not see anything on this pt  LMTCB for [facility]

## 2015-10-24 ENCOUNTER — Ambulatory Visit (INDEPENDENT_AMBULATORY_CARE_PROVIDER_SITE_OTHER)
Admission: RE | Admit: 2015-10-24 | Discharge: 2015-10-24 | Disposition: A | Discharge status: Home / Self Care | Payer: Worker's Compensation | Source: Ambulatory Visit | Attending: Internal Medicine | Admitting: Internal Medicine

## 2015-10-24 DIAGNOSIS — R918 Other nonspecific abnormal finding of lung field: Secondary | ICD-10-CM

## 2015-10-27 ENCOUNTER — Telehealth: Payer: Self-pay | Admitting: Internal Medicine

## 2015-10-27 NOTE — Progress Notes (Signed)
lmtcb

## 2015-10-27 NOTE — Telephone Encounter (Signed)
Patient notified of CT results. No questions or concerns at this time. Nothing further needed.

## 2015-10-27 NOTE — Telephone Encounter (Signed)
Spoke with Lattie Haw at [facility]. States that she will make [facility] aware of this information. Nothing further was needed.

## 2015-11-11 ENCOUNTER — Encounter: Payer: Self-pay | Admitting: Cardiology

## 2015-11-26 ENCOUNTER — Encounter: Payer: Self-pay | Admitting: Cardiology

## 2015-11-26 ENCOUNTER — Ambulatory Visit (INDEPENDENT_AMBULATORY_CARE_PROVIDER_SITE_OTHER): Payer: Medicare Other | Admitting: Cardiology

## 2015-11-26 VITALS — BP 160/80 | HR 54 | Ht 66.0 in | Wt 203.4 lb

## 2015-11-26 DIAGNOSIS — I10 Essential (primary) hypertension: Secondary | ICD-10-CM | POA: Diagnosis not present

## 2015-11-26 DIAGNOSIS — E7800 Pure hypercholesterolemia, unspecified: Secondary | ICD-10-CM

## 2015-11-26 DIAGNOSIS — I2510 Atherosclerotic heart disease of native coronary artery without angina pectoris: Secondary | ICD-10-CM

## 2015-11-26 DIAGNOSIS — R001 Bradycardia, unspecified: Secondary | ICD-10-CM | POA: Diagnosis not present

## 2015-11-26 HISTORY — DX: Bradycardia, unspecified: R00.1

## 2015-11-26 LAB — HEPATIC FUNCTION PANEL
ALT: 16 U/L (ref 9–46)
AST: 14 U/L (ref 10–35)
Albumin: 3.9 g/dL (ref 3.6–5.1)
Alkaline Phosphatase: 56 U/L (ref 40–115)
BILIRUBIN DIRECT: 0.1 mg/dL (ref <=0.2)
Indirect Bilirubin: 0.7 mg/dL (ref 0.2–1.2)
TOTAL PROTEIN: 6.6 g/dL (ref 6.1–8.1)
Total Bilirubin: 0.8 mg/dL (ref 0.2–1.2)

## 2015-11-26 LAB — LIPID PANEL
CHOLESTEROL: 269 mg/dL — AB (ref 125–200)
HDL: 43 mg/dL (ref >=40)
LDL CALC: 175 mg/dL — AB (ref <130)
TRIGLYCERIDES: 257 mg/dL — AB (ref <150)
Total CHOL/HDL Ratio: 6.3 Ratio — ABNORMAL HIGH (ref <=5.0)
VLDL: 51 mg/dL — AB (ref <30)

## 2015-11-26 NOTE — Progress Notes (Signed)
Cardiology Office Note    Date:  11/26/2015   ID:  [name], DOB 10-Mar-1946, MRN [id number]  PCP:  Mayra Neer, MD  Cardiologist:  Fransico Him, MD   Chief Complaint  Patient presents with  . Coronary Artery Disease  . Hypertension  . Hyperlipidemia    History of Present Illness:  Caleb Anderson is a 70 y.o. male with a history of HTN, dyslipidemia and coronary artery calcifications on chest CT and no ischemia on nuclear stress test in 2013 who presents today for annual followup. He is doing well. He denies any chest pain, SOB, DOE, LE edema, dizziness, palpitations or syncope. He does not get any formal exercise but walks around 1 mile daily and plays golf.     Past Medical History:  Diagnosis Date  . Asbestosis ([place])   . Bradycardia 11/26/2015  . Coronary artery disease    coronary artery calcifications by chest CT with normal nuclear stress test 2013  . GERD (gastroesophageal reflux disease)   . Glaucoma   . Hyperlipidemia   . Hypertension     No past surgical history on file.  Current Medications: Outpatient Medications Prior to Visit  Medication Sig Dispense Refill  . aspirin 81 MG tablet Take 81 mg by mouth daily.    . Dorzolamide HCl-Timolol Mal PF 22.3-6.8 MG/ML SOLN Place 1 drop into both eyes 2 (two) times daily.     Marland Kitchen latanoprost (XALATAN) 0.005 % ophthalmic solution Place 1 drop into both eyes at bedtime.     Marland Kitchen lisinopril (PRINIVIL,ZESTRIL) 20 MG tablet Take 20 mg by mouth daily.    . naproxen sodium (ANAPROX) 220 MG tablet Take 220 mg by mouth 2 (two) times daily with a meal.     No facility-administered medications prior to visit.      Allergies:   Fish oil; Statins; Trilipix [choline fenofibrate]; Welchol [colesevelam hcl]; and Zetia [ezetimibe]   Social History   Social History  . Marital status: Married    Spouse name: N/A  . Number of children: N/A  . Years of education: N/A   Social History Main Topics  . Smoking status:  Former Smoker    Packs/day: 1.00    Years: 15.00    Types: Cigarettes    Quit date: 03/22/1990  . Smokeless tobacco: Never Used  . Alcohol use 0.0 oz/week     Comment: ocassionally  . Drug use: No  . Sexual activity: Not Asked   Other Topics Concern  . None   Social History Narrative  . None     Family History:  The patient's family history includes COPD in his mother and sister; Heart attack in his brother; Heart disease in his brother; Kidney failure in his father.   ROS:   Please see the history of present illness.    ROS All other systems reviewed and are negative.   PHYSICAL EXAM:   VS:  BP (!) 160/80   Pulse (!) 54   Ht 5\' 6"  (1.676 m)   Wt 203 lb 6.4 oz (92.3 kg)   BMI 32.83 kg/m    GEN: Well nourished, well developed, in no acute distress  HEENT: normal  Neck: no JVD, carotid bruits, or masses Cardiac: RRR; no murmurs, rubs, or gallops,no edema.  Intact distal pulses bilaterally.  Respiratory:  clear to auscultation bilaterally, normal work of breathing GI: soft, nontender, nondistended, + BS MS: no deformity or atrophy  Skin: warm and dry, no rash Neuro:  Alert and  Oriented x 3, Strength and sensation are intact Psych: euthymic mood, full affect  Wt Readings from Last 3 Encounters:  11/26/15 203 lb 6.4 oz (92.3 kg)  09/22/15 197 lb (89.4 kg)  10/21/14 195 lb 1.9 oz (88.5 kg)      Studies/Labs Reviewed:   EKG:  EKG is ordered today.  The ekg ordered today demonstrates sinus bradycardia at 47bpm with no ST changes  Recent Labs: No results found for requested labs within last 8760 hours.   Lipid Panel    Component Value Date/Time   CHOL 287 (H) 08/21/2013 0944   TRIG 255.0 (H) 08/21/2013 0944   HDL 42.50 08/21/2013 0944   CHOLHDL 7 08/21/2013 0944   VLDL 51.0 (H) 08/21/2013 0944   LDLCALC 194 (H) 08/21/2013 0944    Additional studies/ records that were reviewed today include:  none    ASSESSMENT:    1. Coronary artery calcification seen  on CAT scan   2. Essential hypertension   3. Hypercholesterolemia   4. Bradycardia      PLAN:  In order of problems listed above:  1. Coronary artery calcifications on Chest CT with no angina.  Needs aggressive risk factor modification. Continue ASA. 2. HTN - BP borderline controlled on current meds.  Continue ACE I. 3. Hyperlipidemia with LDL goal < 70. He is statin intolerant so if his LDL is > 70 he may be a candidate for [place].  He was on the study drug until 6 months ago. Check FLp and ALT today. 4. Asymptomatic bradycardia - he has no dizziness, presyncope, syncope or fatigue.     Medication Adjustments/Labs and Tests Ordered: Current medicines are reviewed at length with the patient today.  Concerns regarding medicines are outlined above.  Medication changes, Labs and Tests ordered today are listed in the Patient Instructions below.  There are no Patient Instructions on file for this visit.   Signed, Fransico Him, MD  11/26/2015 9:16 AM    [facility] [place], [place], [place]  [address] Phone: [phone]; Fax: [phone]

## 2015-11-26 NOTE — Patient Instructions (Signed)
Medication Instructions:  Your physician recommends that you continue on your current medications as directed. Please refer to the Current Medication list given to you today.   Labwork: TODAY: LFTs, Lipids  Testing/Procedures: None  Follow-Up: Your physician wants you to follow-up in: 1 year with Dr. Turner. You will receive a reminder letter in the mail two months in advance. If you don't receive a letter, please call our office to schedule the follow-up appointment.   Any Other Special Instructions Will Be Listed Below (If Applicable).     If you need a refill on your cardiac medications before your next appointment, please call your pharmacy.   

## 2015-11-27 ENCOUNTER — Telehealth: Payer: Self-pay | Admitting: Cardiology

## 2015-11-27 NOTE — Telephone Encounter (Signed)
New message ° ° °Pt verbalized that he is calling for lab results  °

## 2015-11-27 NOTE — Telephone Encounter (Signed)
-----   Message from Leeroy Bock, [facility] sent at 11/27/2015  1:27 PM EDT ----- Pt intolerant to 4 statins and Zetia with LDL far above goal < 70 given CAD. Please schedule pt in lipid clinic to discuss PCSK9i or clinical trial to help lower his cholesterol.

## 2015-11-27 NOTE — Telephone Encounter (Signed)
Informed patient of results and verbal understanding expressed.  Scheduled patient September 28th for Lipid Clinic. Patient agrees with treatment plan.

## 2015-12-01 DIAGNOSIS — M1990 Unspecified osteoarthritis, unspecified site: Secondary | ICD-10-CM | POA: Diagnosis not present

## 2015-12-01 DIAGNOSIS — Z125 Encounter for screening for malignant neoplasm of prostate: Secondary | ICD-10-CM | POA: Diagnosis not present

## 2015-12-01 DIAGNOSIS — Z23 Encounter for immunization: Secondary | ICD-10-CM | POA: Diagnosis not present

## 2015-12-01 DIAGNOSIS — E782 Mixed hyperlipidemia: Secondary | ICD-10-CM | POA: Diagnosis not present

## 2015-12-01 DIAGNOSIS — I2510 Atherosclerotic heart disease of native coronary artery without angina pectoris: Secondary | ICD-10-CM | POA: Diagnosis not present

## 2015-12-01 DIAGNOSIS — I119 Hypertensive heart disease without heart failure: Secondary | ICD-10-CM | POA: Diagnosis not present

## 2015-12-01 DIAGNOSIS — K219 Gastro-esophageal reflux disease without esophagitis: Secondary | ICD-10-CM | POA: Diagnosis not present

## 2015-12-01 DIAGNOSIS — E669 Obesity, unspecified: Secondary | ICD-10-CM | POA: Diagnosis not present

## 2015-12-01 DIAGNOSIS — Z1211 Encounter for screening for malignant neoplasm of colon: Secondary | ICD-10-CM | POA: Diagnosis not present

## 2015-12-01 DIAGNOSIS — N529 Male erectile dysfunction, unspecified: Secondary | ICD-10-CM | POA: Diagnosis not present

## 2015-12-01 DIAGNOSIS — H409 Unspecified glaucoma: Secondary | ICD-10-CM | POA: Diagnosis not present

## 2015-12-01 DIAGNOSIS — Z0000 Encounter for general adult medical examination without abnormal findings: Secondary | ICD-10-CM | POA: Diagnosis not present

## 2015-12-17 NOTE — Progress Notes (Signed)
Patient ID: Caleb Anderson                 DOB: November 03, 1945                    MRN: [id number]     HPI: Caleb Anderson is a 70 y.o. male patient referred to lipid clinic by Dr. Radford Pax. PMH is significant for HTN, HLD, and coronary artery calcifications of the left main, left anterior descending, left circumflex, and right coronary arteries on chest CT consistent with 3 vessel CAD. He has intolerances with 4 statins (atorvastatin, pravastatin, rosuvastatin, and simvastatin), Zetia, and Welchol, and presents to lipid clinic for further management.  Patient has tried 4 statins at lowest dose in addition to [place]. He experienced joint and muscle aches in addition to fatigue with each of these. He reports symptoms would start 1-4 weeks after drug initiation. Symptoms would resolve a few days after medication discontinuation.  He was previously in the [facility] study last year. He does not want to participate in another trial because he is frustrated he never heard any follow up on his lab work or which treatment group he was in during previous study.  Current Medications: none Intolerances: atorvastatin 10mg  daily, pravastatin 10mg  daily, rosuvastatin 10mg  once a week, simvastatin - joint pain and fatigue. Welchol - myalgias. Zetia - joint pain and fatigue. Risk Factors: 3 vessel CAD per CT in 10/2015 LDL goal: 70mg /dL  Diet: Oatmeal bar and coffee for breakfast. Lunch - sandwich or salad with chicken. Dinner - pizza, BBQ, chicken and vegetables  Exercise: Walks 1 mile daily and plays golf.  Family History: Brother with heart disease and MI. Mother and sister with COPD. Father with kidney failure.  Social History: Patient was a former smoker of 1PPD for 15 years. Quit in 1992. Drinks alcohol occasionally, denies illicit drug use.  Labs: 11/26/2015: TC 269, TG 257, HDL 43, LDL 175 (no therapy)  Past Medical History:  Diagnosis Date  . Asbestosis ([place])   . Bradycardia 11/26/2015  .  Coronary artery disease    coronary artery calcifications by chest CT with normal nuclear stress test 2013  . GERD (gastroesophageal reflux disease)   . Glaucoma   . Hyperlipidemia   . Hypertension     Current Outpatient Prescriptions on File Prior to Visit  Medication Sig Dispense Refill  . aspirin 81 MG tablet Take 81 mg by mouth daily.    . Dorzolamide HCl-Timolol Mal PF 22.3-6.8 MG/ML SOLN Place 1 drop into both eyes 2 (two) times daily.     Marland Kitchen latanoprost (XALATAN) 0.005 % ophthalmic solution Place 1 drop into both eyes at bedtime.     Marland Kitchen lisinopril (PRINIVIL,ZESTRIL) 20 MG tablet Take 20 mg by mouth daily.    . naproxen sodium (ANAPROX) 220 MG tablet Take 220 mg by mouth 2 (two) times daily with a meal.     No current facility-administered medications on file prior to visit.     Allergies  Allergen Reactions  . Fish Oil     Weight gain   . Statins Other (See Comments)    Joint pain and fatigue(Lipitor, crestor, simvastatin, and pravastatin)  . Trilipix [Choline Fenofibrate] Other (See Comments)    Joint pain/fatigue  . Welchol [Colesevelam Hcl]     Aches   . Zetia [Ezetimibe] Other (See Comments)    Joint pain/fatigue    Assessment/Plan:  1. Hyperlipidemia - LDL most recently 175mg /dL far above goal <  70mg /dL given 3 vessel CAD. Pt is intolerant to 4 statins, Zetia, and Welchol. PCSK9i is the only option to bring LDL to goal. Discussed expected benefits, side effects, and injection technique with patient. Will submit paperwork for Repatha coverage. Discussed that copay will be a minimum of $100, he is willing to pay this amount but not much higher. If PCSK9i is not affordable, he does not want to participate in a clinical trial. He may be willing to rechallenge with statin therapy again.   Recie Cirrincione E. Lynnlee Anderson, PharmD, [place] [id number], [place], [place] [address] Phone: [phone]; Fax: [phone] 12/18/2015 9:24 AM

## 2015-12-18 ENCOUNTER — Ambulatory Visit (INDEPENDENT_AMBULATORY_CARE_PROVIDER_SITE_OTHER): Payer: Medicare Other | Admitting: Pharmacist

## 2015-12-18 VITALS — Wt 197.8 lb

## 2015-12-18 DIAGNOSIS — I2510 Atherosclerotic heart disease of native coronary artery without angina pectoris: Secondary | ICD-10-CM | POA: Diagnosis not present

## 2015-12-18 DIAGNOSIS — R7303 Prediabetes: Secondary | ICD-10-CM | POA: Diagnosis not present

## 2015-12-18 DIAGNOSIS — E7800 Pure hypercholesterolemia, unspecified: Secondary | ICD-10-CM | POA: Diagnosis not present

## 2015-12-18 NOTE — Patient Instructions (Signed)
It was nice to meet you today.  I will start the paperwork for Repatha coverage for your cholesterol.  Call Megan in the lipid clinic with any questions [phone].

## 2015-12-22 ENCOUNTER — Telehealth: Payer: Self-pay | Admitting: Pharmacist

## 2015-12-22 MED ORDER — EVOLOCUMAB 140 MG/ML ~~LOC~~ SOAJ: 1.0000 "pen " | SUBCUTANEOUS | 11 refills | Status: DC

## 2015-12-22 NOTE — Telephone Encounter (Signed)
Repatha approved 9/29-17 - 12/18/17. Pt made aware and rx sent to [facility]. Pt will call clinic once he receives his first shipment.

## 2016-01-06 ENCOUNTER — Telehealth: Payer: Self-pay | Admitting: Pharmacist

## 2016-01-06 MED ORDER — PRAVASTATIN SODIUM 20 MG PO TABS: 20.0000 mg | ORAL_TABLET | Freq: Every evening | ORAL | 11 refills | Status: DC

## 2016-01-06 NOTE — Telephone Encounter (Signed)
Called patient to see if he had started Repatha injections yet. He stated that his copay is unaffordable at $400 a month. He does not qualify for patient assistance. He is already intolerant to atorvastatin 10mg  daily, pravastatin 10mg  daily, rosuvastatin 10mg  once a week, and simvastatin (myalgias with all). Also intolerant to Welchol and Zetia. He does not want to be in a clinical trial because he states he had a bad experience when he was in the [place] him that he has exhausted our options for lipid lowering options. He then stated he would be willing to retry another statin. He has already tried 4 statins at their lowest doses but will rechallenge again with pravastatin since patient is interested. Advised him to call with tolerability. If he doesn't tolerate pravastatin, advised him we are out of options unless he is willing to participate in a clinical trial.

## 2016-04-16 DIAGNOSIS — H401111 Primary open-angle glaucoma, right eye, mild stage: Secondary | ICD-10-CM | POA: Diagnosis not present

## 2016-05-19 DIAGNOSIS — H401111 Primary open-angle glaucoma, right eye, mild stage: Secondary | ICD-10-CM | POA: Diagnosis not present

## 2016-06-30 DIAGNOSIS — H401111 Primary open-angle glaucoma, right eye, mild stage: Secondary | ICD-10-CM | POA: Diagnosis not present

## 2016-07-21 ENCOUNTER — Other Ambulatory Visit: Payer: Self-pay | Admitting: Internal Medicine

## 2016-07-21 DIAGNOSIS — R0600 Dyspnea, unspecified: Secondary | ICD-10-CM

## 2016-07-21 NOTE — Progress Notes (Unsigned)
PFT done today. 

## 2016-07-22 ENCOUNTER — Ambulatory Visit (INDEPENDENT_AMBULATORY_CARE_PROVIDER_SITE_OTHER): Payer: Worker's Compensation | Admitting: Internal Medicine

## 2016-07-22 ENCOUNTER — Encounter: Payer: Self-pay | Admitting: Internal Medicine

## 2016-07-22 VITALS — BP 138/86 | HR 51 | Ht 64.0 in | Wt 190.0 lb

## 2016-07-22 DIAGNOSIS — R0600 Dyspnea, unspecified: Secondary | ICD-10-CM

## 2016-07-22 DIAGNOSIS — Z77090 Contact with and (suspected) exposure to asbestos: Secondary | ICD-10-CM

## 2016-07-22 DIAGNOSIS — R918 Other nonspecific abnormal finding of lung field: Secondary | ICD-10-CM

## 2016-07-22 DIAGNOSIS — I10 Essential (primary) hypertension: Secondary | ICD-10-CM | POA: Diagnosis not present

## 2016-07-22 LAB — PULMONARY FUNCTION TEST
DL/VA % pred: 113 %
DL/VA: 4.72 ml/min/mmHg/L
DLCO UNC % PRED: 94 %
DLCO UNC: 22.88 ml/min/mmHg
DLCO cor % pred: 101 %
DLCO cor: 24.64 ml/min/mmHg
FEF 25-75 POST: 2.34 L/s
FEF 25-75 PRE: 1.78 L/s
FEF2575-%Change-Post: 31 %
FEF2575-%PRED-POST: 119 %
FEF2575-%PRED-PRE: 90 %
FEV1-%Change-Post: 5 %
FEV1-%PRED-POST: 102 %
FEV1-%Pred-Pre: 97 %
FEV1-Post: 2.6 L
FEV1-Pre: 2.47 L
FEV1FVC-%Change-Post: 8 %
FEV1FVC-%PRED-PRE: 101 %
FEV6-%CHANGE-POST: -2 %
FEV6-%PRED-POST: 98 %
FEV6-%Pred-Pre: 101 %
FEV6-Post: 3.19 L
FEV6-Pre: 3.29 L
FEV6FVC-%CHANGE-POST: 0 %
FEV6FVC-%PRED-PRE: 107 %
FEV6FVC-%Pred-Post: 107 %
FVC-%Change-Post: -2 %
FVC-%Pred-Post: 91 %
FVC-%Pred-Pre: 94 %
FVC-Post: 3.2 L
FVC-Pre: 3.3 L
PRE FEV1/FVC RATIO: 75 %
Post FEV1/FVC ratio: 81 %
Post FEV6/FVC ratio: 100 %
Pre FEV6/FVC Ratio: 100 %
RV % PRED: 74 %
RV: 1.58 L
TLC % pred: 81 %
TLC: 4.75 L

## 2016-07-22 NOTE — Patient Instructions (Addendum)
You do not need to have any further CT scans at this point unless new symptoms arise  If you develop a cough more than just the common cold, see your Primary care provide and  I would recommend a trial off lisinopril and on an alternative for a month before before returning here.

## 2016-07-22 NOTE — Progress Notes (Signed)
PFT done today. 

## 2016-07-22 NOTE — Progress Notes (Signed)
Subjective:    Patient ID: Caleb Anderson, male    DOB: Mar 22, 1946,    MRN: [id number]    Brief patient profile: 11 yowm  Quit smoking in 1992 doing serial f/u by Pearlie Oyster with MPN's in 07/24/15 (vs early pleural plaques) but no symptoms so referred to pulmonary clinic 09/22/2015 by Dr Arvil Chaco.   09/22/2015 1st [facility] office visit/ Wert   Chief Complaint  Patient presents with  . Pulmonary Consult    Referred by Dr. Serita Grammes for eval of abnormal ct chest. Pt denies any respiratory co's today. n   last known exposure to asbestos at [employer] in  1980's at [facility]  Cp's dating back at least 7 years always ant / typically on L above breast are lasting from a few minutes to up to an hour / not noted with breathing or coughing or exertion assoc with oc hb/indigestion, never supine rec You will need to return 1st week in May 2018 for full pfts     07/22/2016  f/u ov/Wert re:  ? Asbestosis  Chief Complaint  Patient presents with  . Follow-up    PFT's done today. Breathing is doing well and he denies any co's today.      Not limited by breathing from desired activities    No obvious day to day or daytime variability or assoc excess/ purulent sputum or mucus plugs or hemoptysis or cp or chest tightness, subjective wheeze or overt sinus or hb symptoms. No unusual exp hx or h/o childhood pna/ asthma or knowledge of premature birth.  Sleeping ok without nocturnal  or early am exacerbation  of respiratory  c/o's or need for noct saba. Also denies any obvious fluctuation of symptoms with weather or environmental changes or other aggravating or alleviating factors except as outlined above   Current Medications, Allergies, Complete Past Medical History, Past Surgical History, Family History, and Social History were reviewed in [employer] record.  ROS  The following are not active complaints unless bolded sore throat, dysphagia, dental problems,  itching, sneezing,  nasal congestion or excess/ purulent secretions, ear ache,   fever, chills, sweats, unintended wt loss, classically pleuritic or exertional cp,  orthopnea pnd or leg swelling, presyncope, palpitations, abdominal pain, anorexia, nausea, vomiting, diarrhea  or change in bowel or bladder habits, change in stools or urine, dysuria,hematuria,  rash, arthralgias, visual complaints, headache, numbness, weakness or ataxia or problems with walking or coordination,  change in mood/affect or memory.              Objective:   Physical Exam  Still somewhat somber amb wm nad  07/22/2016         190   09/22/15 197 lb (89.359 kg)  10/21/14 195 lb 1.9 oz (88.506 kg)  09/05/13 193 lb (87.544 kg)    Vital signs reviewed - Note on arrival 02 sats  98% on RA     HEENT: nl dentition, turbinates, and oropharynx. Nl external ear canals without cough reflex   NECK :  without JVD/Nodes/TM/ nl carotid upstrokes bilaterally   LUNGS: no acc muscle use,  Nl contour chest which is clear to A and P bilaterally without cough on insp or exp maneuvers   CV:  RRR  no s3 or murmur or increase in P2, no edema   ABD:  soft and nontender with nl inspiratory excursion in the supine position. No bruits or organomegaly, bowel sounds nl  MS:  Nl  gait/ ext warm without deformities, calf tenderness, cyanosis or clubbing No obvious joint restrictions   SKIN: warm and dry without lesions    NEURO:  alert, approp, nl sensorium with  no motor deficits       I personally reviewed images and agree with radiology impression as follows:  CT 10/25/15  Chest HRCT 1. No evidence of interstitial lung disease. 2. No change in several small pulmonary nodules in the right lung, all of which are unchanged compared to prior study from 03/13/2007, and subpleural in location, considered definitively benign (presumably subpleural lymph nodes).     Assessment & Plan:

## 2016-07-24 NOTE — Assessment & Plan Note (Signed)
Last exposure was 1980's - CT chest  10/25/15 neg for asbestos > no further f/u indicated - PFT's  07/22/2016   wnl

## 2016-07-24 NOTE — Assessment & Plan Note (Signed)
Quit asbestos exp 1980's Quit smoking 1992 CT Chest Sali4/17 : 1. No evidence of interstitial lung disease. 2. No change in several small pulmonary nodules in the right lung, all of which are unchanged compared to prior study from 03/13/2007, and subpleural in location, considered definitively benign (presumably subpleural lymph nodes).   Discussed in detail all the  indications, usual  risks and alternatives  relative to the benefits with patient who agrees to proceed with conservative f/u as outlined  = no dedicated f/u needed per fleischner society guidelines.

## 2016-07-24 NOTE — Assessment & Plan Note (Signed)
Note well controlled on ACEi but risks overlap with pulmonary symptoms - if they develop should keep in mind:  In the best review of chronic cough to date ( NEJM 2016 375 [phone]) ,  ACEi are now felt to cause cough in up to  20% of pts which is a 4 fold increase from previous reports and does not include the variety of non-specific complaints we see in pulmonary clinic in pts on ACEi but previously attributed to another dx like  Copd/asthma and  include PNDS, throat and chest congestion, "bronchitis", unexplained dyspnea and noct "strangling" sensations, and hoarseness, but also  atypical /refractory GERD symptoms like dysphagia and "bad heartburn"   The only way I know  to prove new/ chronic resp symptoms don't represent  An  "ACEi Case" is a trial off ACEi x a minimum of 6 weeks then re-evaluate at that point back here in pulmonary clinic

## 2016-11-04 DIAGNOSIS — H401131 Primary open-angle glaucoma, bilateral, mild stage: Secondary | ICD-10-CM | POA: Diagnosis not present

## 2016-12-28 DIAGNOSIS — Z23 Encounter for immunization: Secondary | ICD-10-CM | POA: Diagnosis not present

## 2017-02-17 DIAGNOSIS — N529 Male erectile dysfunction, unspecified: Secondary | ICD-10-CM | POA: Diagnosis not present

## 2017-02-17 DIAGNOSIS — E782 Mixed hyperlipidemia: Secondary | ICD-10-CM | POA: Diagnosis not present

## 2017-02-17 DIAGNOSIS — E669 Obesity, unspecified: Secondary | ICD-10-CM | POA: Diagnosis not present

## 2017-02-17 DIAGNOSIS — Z0000 Encounter for general adult medical examination without abnormal findings: Secondary | ICD-10-CM | POA: Diagnosis not present

## 2017-02-17 DIAGNOSIS — K219 Gastro-esophageal reflux disease without esophagitis: Secondary | ICD-10-CM | POA: Diagnosis not present

## 2017-02-17 DIAGNOSIS — I119 Hypertensive heart disease without heart failure: Secondary | ICD-10-CM | POA: Diagnosis not present

## 2017-02-17 DIAGNOSIS — R7303 Prediabetes: Secondary | ICD-10-CM | POA: Diagnosis not present

## 2017-02-17 DIAGNOSIS — Z125 Encounter for screening for malignant neoplasm of prostate: Secondary | ICD-10-CM | POA: Diagnosis not present

## 2017-02-17 DIAGNOSIS — Z6831 Body mass index (BMI) 31.0-31.9, adult: Secondary | ICD-10-CM | POA: Diagnosis not present

## 2017-02-17 DIAGNOSIS — I2510 Atherosclerotic heart disease of native coronary artery without angina pectoris: Secondary | ICD-10-CM | POA: Diagnosis not present

## 2017-02-17 DIAGNOSIS — M1990 Unspecified osteoarthritis, unspecified site: Secondary | ICD-10-CM | POA: Diagnosis not present

## 2017-02-17 DIAGNOSIS — H409 Unspecified glaucoma: Secondary | ICD-10-CM | POA: Diagnosis not present

## 2017-03-02 ENCOUNTER — Telehealth: Payer: Self-pay | Admitting: Pharmacist

## 2017-03-02 MED ORDER — EVOLOCUMAB 140 MG/ML ~~LOC~~ SOAJ: 1.0000 "pen " | SUBCUTANEOUS | 11 refills | Status: DC

## 2017-03-02 NOTE — Telephone Encounter (Signed)
Lipid referral per Dr Radford Pax based on recent lipid panel showing LDL of 180 above goal < 70 due to hx of ASCVD. Pt was seen in the lipid clinic in 2017 - he is intolerant to atorvastatin 10mg  daily, pravastatin 10mg  daily, rosuvastatin 10mg  once a week, and simvastatin (myalgias with all). He was approved for Repatha but his copay was cost prohibitive at $400 per month. He deferred participation in clinical trials.   With recent price drop of Repatha, we have seen copays reduce by ~60% which would bring copay to about $160 per month. Pt states this is affordable. Will send rx in to specialty pharmacy to confirm copay information (previous prior authorization still on file).

## 2017-03-03 NOTE — Telephone Encounter (Signed)
Copay for [facility] came back at $144.05 per month. Specialty pharmacy will reach out to pt to coordinate shipment.

## 2017-03-31 DIAGNOSIS — R972 Elevated prostate specific antigen [PSA]: Secondary | ICD-10-CM | POA: Diagnosis not present

## 2017-03-31 DIAGNOSIS — N5201 Erectile dysfunction due to arterial insufficiency: Secondary | ICD-10-CM | POA: Diagnosis not present

## 2017-04-07 NOTE — Telephone Encounter (Signed)
Called pt to see if he had received his Repatha shipment in the mail yet. He states he heard from the pharmacy but asked them not to ship it due to cost. He states he has restarted one of his older medicines to see if he could tolerate it better, however does not recall the name of the statin. Later in the phone visit he says he has not actually started this either. Asked if pt would like to come in for a lipid visit to discuss options again but he declined and stated he would call us when he decides which medication he would like to take.

## 2017-06-23 DIAGNOSIS — R972 Elevated prostate specific antigen [PSA]: Secondary | ICD-10-CM | POA: Diagnosis not present

## 2017-06-24 DIAGNOSIS — H401131 Primary open-angle glaucoma, bilateral, mild stage: Secondary | ICD-10-CM | POA: Diagnosis not present

## 2017-06-30 DIAGNOSIS — R972 Elevated prostate specific antigen [PSA]: Secondary | ICD-10-CM | POA: Diagnosis not present

## 2017-06-30 DIAGNOSIS — N5201 Erectile dysfunction due to arterial insufficiency: Secondary | ICD-10-CM | POA: Diagnosis not present

## 2017-08-18 DIAGNOSIS — R7303 Prediabetes: Secondary | ICD-10-CM | POA: Diagnosis not present

## 2017-08-18 DIAGNOSIS — I2510 Atherosclerotic heart disease of native coronary artery without angina pectoris: Secondary | ICD-10-CM | POA: Diagnosis not present

## 2017-08-18 DIAGNOSIS — E669 Obesity, unspecified: Secondary | ICD-10-CM | POA: Diagnosis not present

## 2017-08-18 DIAGNOSIS — M1990 Unspecified osteoarthritis, unspecified site: Secondary | ICD-10-CM | POA: Diagnosis not present

## 2017-08-18 DIAGNOSIS — Z6832 Body mass index (BMI) 32.0-32.9, adult: Secondary | ICD-10-CM | POA: Diagnosis not present

## 2017-08-18 DIAGNOSIS — E782 Mixed hyperlipidemia: Secondary | ICD-10-CM | POA: Diagnosis not present

## 2017-08-18 DIAGNOSIS — I119 Hypertensive heart disease without heart failure: Secondary | ICD-10-CM | POA: Diagnosis not present

## 2017-11-03 ENCOUNTER — Telehealth: Payer: Self-pay | Admitting: Pharmacist

## 2017-11-03 NOTE — Telephone Encounter (Signed)
Spoke with pt regarding lipids - he still does not want to start Repatha injections due to cost. Previously intolerant to multiple statins.  Discussed upcoming lipid trials at [facility] including [address] study. Pt states he is not interested. He is aware we have no other options for lipid lowering therapy at this time.

## 2017-12-23 DIAGNOSIS — H401111 Primary open-angle glaucoma, right eye, mild stage: Secondary | ICD-10-CM | POA: Diagnosis not present

## 2017-12-23 DIAGNOSIS — Z23 Encounter for immunization: Secondary | ICD-10-CM | POA: Diagnosis not present

## 2018-02-02 DIAGNOSIS — H5213 Myopia, bilateral: Secondary | ICD-10-CM | POA: Diagnosis not present

## 2018-02-02 DIAGNOSIS — H401111 Primary open-angle glaucoma, right eye, mild stage: Secondary | ICD-10-CM | POA: Diagnosis not present

## 2018-03-09 DIAGNOSIS — R7303 Prediabetes: Secondary | ICD-10-CM | POA: Diagnosis not present

## 2018-03-09 DIAGNOSIS — H409 Unspecified glaucoma: Secondary | ICD-10-CM | POA: Diagnosis not present

## 2018-03-09 DIAGNOSIS — N529 Male erectile dysfunction, unspecified: Secondary | ICD-10-CM | POA: Diagnosis not present

## 2018-03-09 DIAGNOSIS — Z125 Encounter for screening for malignant neoplasm of prostate: Secondary | ICD-10-CM | POA: Diagnosis not present

## 2018-03-09 DIAGNOSIS — M1990 Unspecified osteoarthritis, unspecified site: Secondary | ICD-10-CM | POA: Diagnosis not present

## 2018-03-09 DIAGNOSIS — E782 Mixed hyperlipidemia: Secondary | ICD-10-CM | POA: Diagnosis not present

## 2018-03-09 DIAGNOSIS — I119 Hypertensive heart disease without heart failure: Secondary | ICD-10-CM | POA: Diagnosis not present

## 2018-03-09 DIAGNOSIS — E669 Obesity, unspecified: Secondary | ICD-10-CM | POA: Diagnosis not present

## 2018-03-09 DIAGNOSIS — Z0000 Encounter for general adult medical examination without abnormal findings: Secondary | ICD-10-CM | POA: Diagnosis not present

## 2018-03-09 DIAGNOSIS — K219 Gastro-esophageal reflux disease without esophagitis: Secondary | ICD-10-CM | POA: Diagnosis not present

## 2018-03-09 DIAGNOSIS — Z6833 Body mass index (BMI) 33.0-33.9, adult: Secondary | ICD-10-CM | POA: Diagnosis not present

## 2018-03-09 DIAGNOSIS — I2510 Atherosclerotic heart disease of native coronary artery without angina pectoris: Secondary | ICD-10-CM | POA: Diagnosis not present

## 2018-05-05 IMAGING — CT CT CHEST HIGH RESOLUTION W/O CM
2 of 5 series · 14 of 36 positions shown, 17 images · non-contrast
Comparison: Chest CT 10/27/2011.

CLINICAL DATA: 69-year-old male with history of asbestos exposure.
Prior history of pulmonary nodules. Followup study.

EXAM:
CT CHEST WITHOUT CONTRAST
TECHNIQUE: Multidetector CT imaging of the chest was performed following the
standard protocol without intravenous contrast. High resolution
imaging of the lungs, as well as inspiratory and expiratory imaging,
was performed.

[Series 4: high resolution · axial · 0.74mm/px · z∈[-285,-31]mm · 11 of 141 slices shown, 14 images]
[im 7/141  mediastinal]
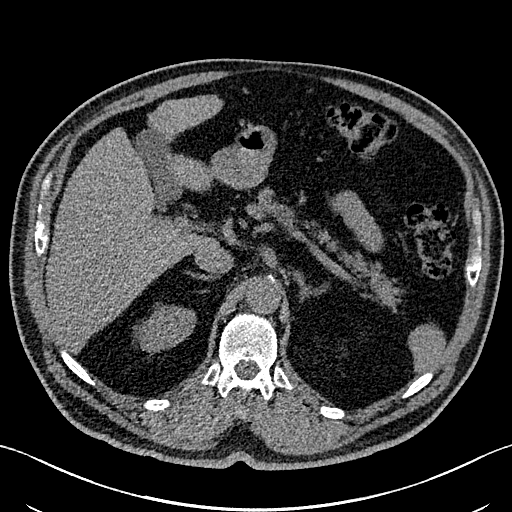
[im 7/141  lung]
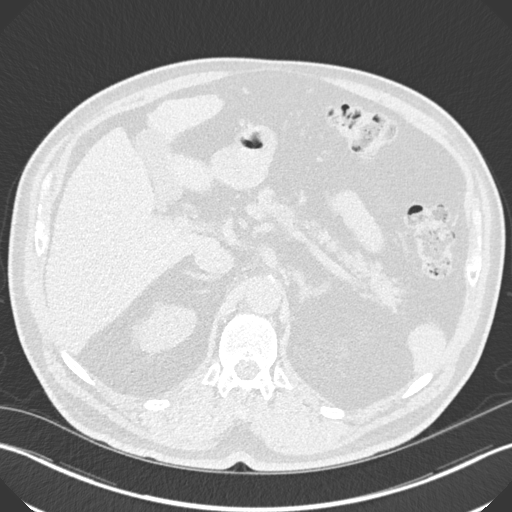
[im 20/141  lung]
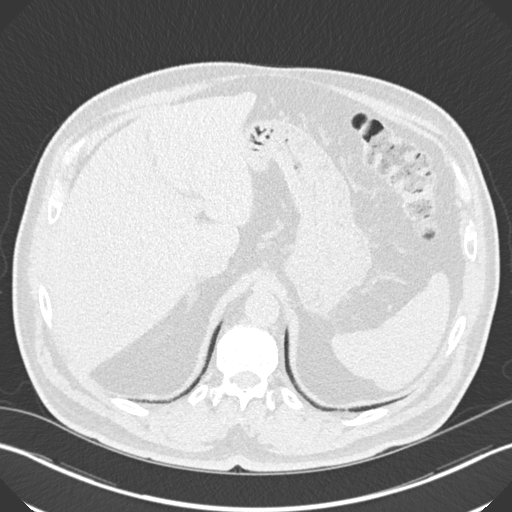
[im 32/141  lung]
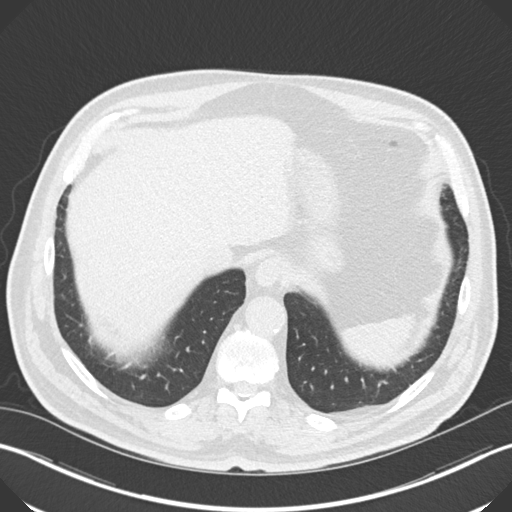
[im 45/141  lung]
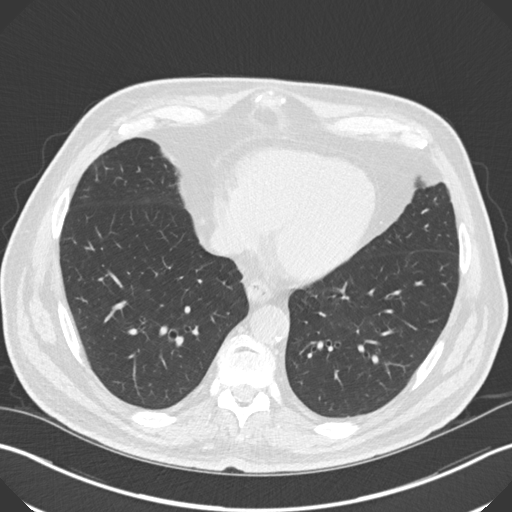
[im 58/141  mediastinal]
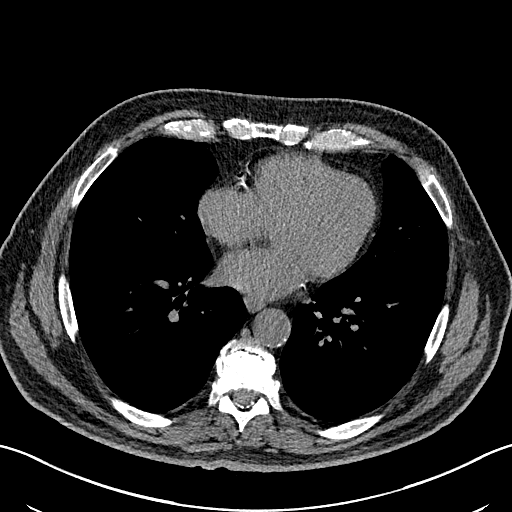
[im 58/141  lung]
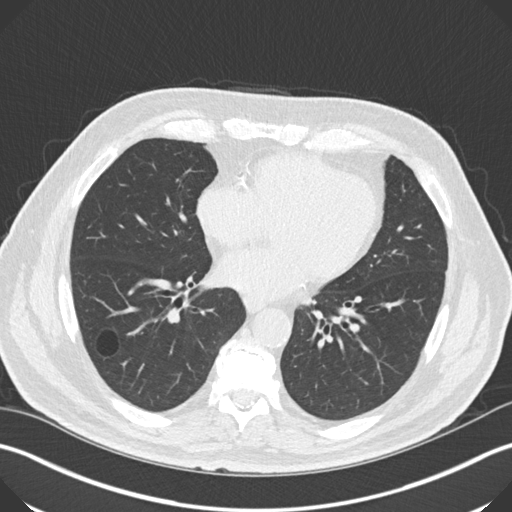
[im 71/141  lung]
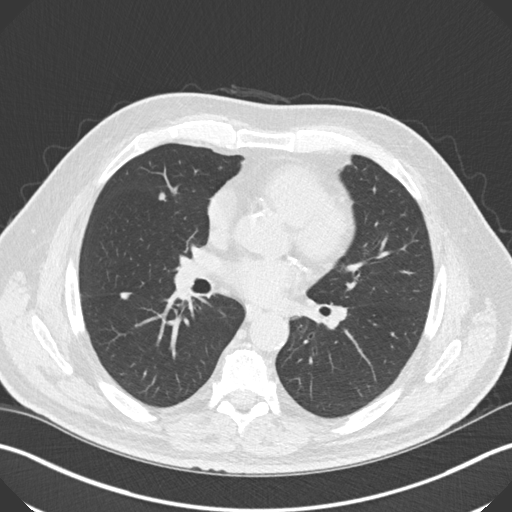
[im 83/141  lung]
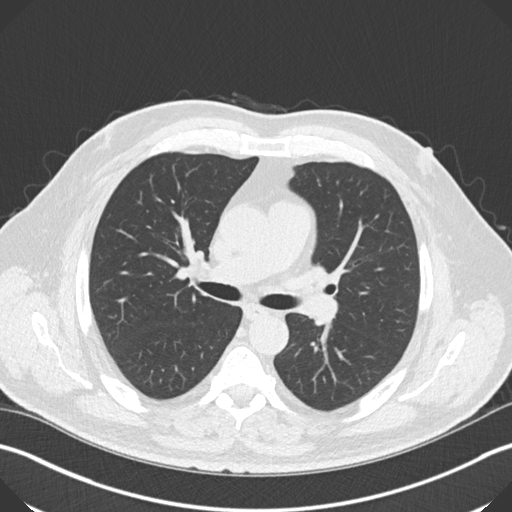
[im 96/141  lung]
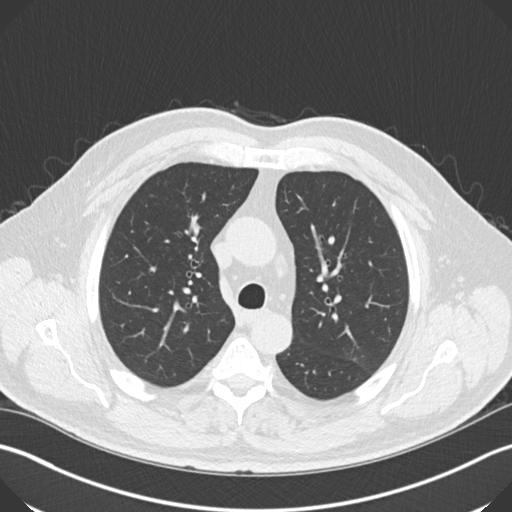
[im 109/141  mediastinal]
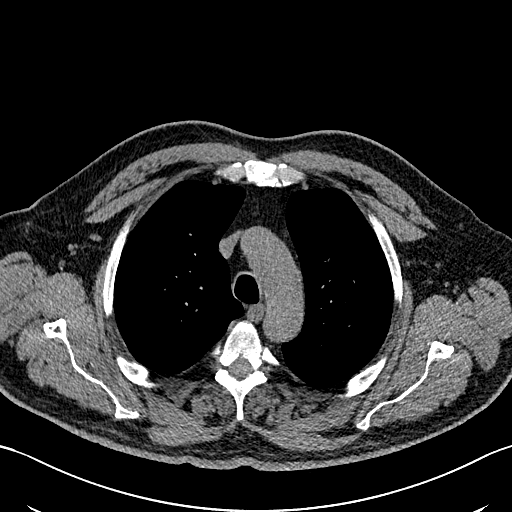
[im 109/141  lung]
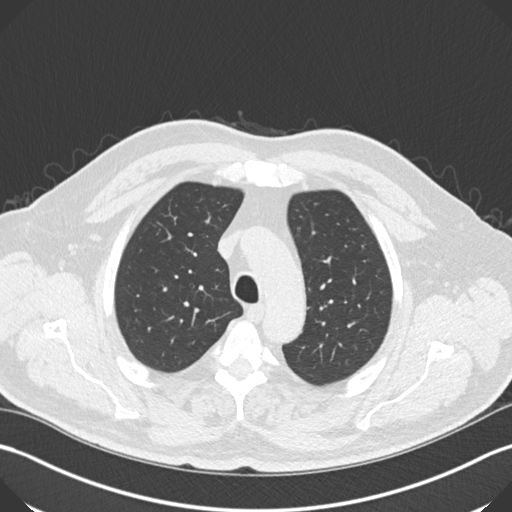
[im 121/141  lung]
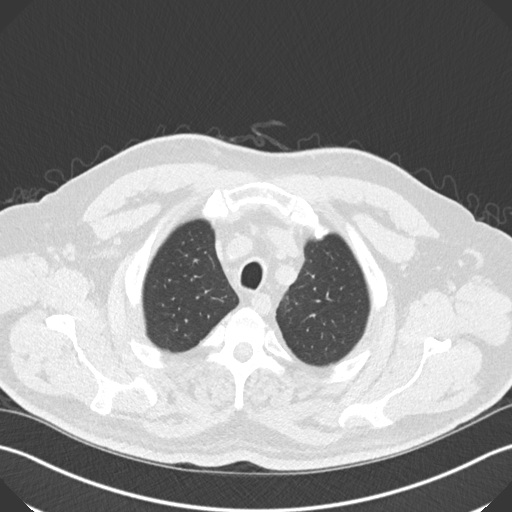
[im 134/141  lung]
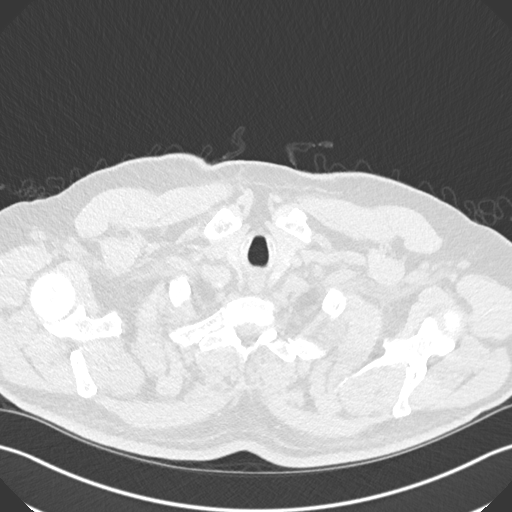

[Series 8: coronal · coronal · 0.59mm/px · 3 of 131 slices shown]
[im 27/131  lung]
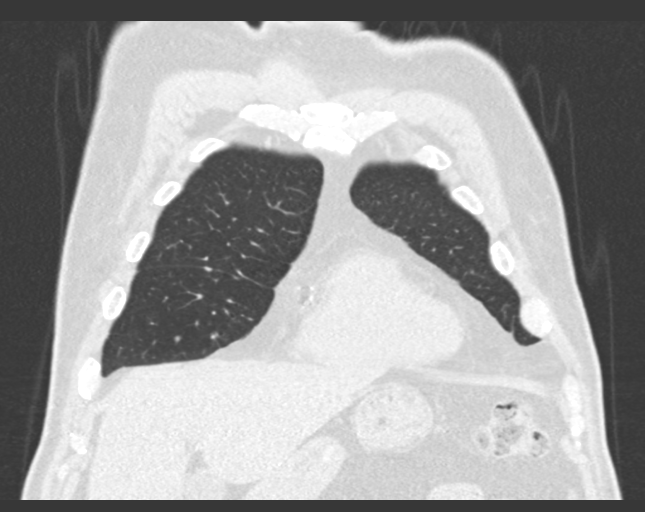
[im 53/131  lung]
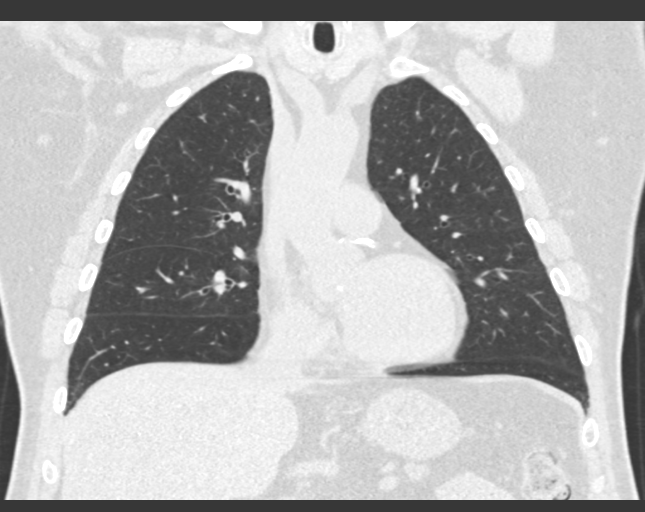
[im 79/131  lung]
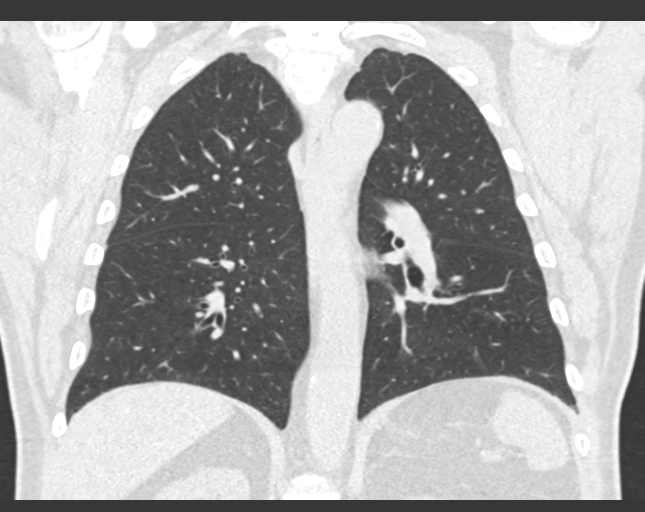

[14 of 36 positions shown; findings below may reference images not displayed]

FINDINGS: Cardiovascular: Heart size is normal. There is no significant
pericardial fluid, thickening or pericardial calcification. There is
aortic atherosclerosis, as well as atherosclerosis of the great
vessels of the mediastinum and the coronary arteries, including
calcified atherosclerotic plaque in the left main, left anterior
descending, left circumflex and right coronary arteries.
Calcifications of the aortic valve.

Mediastinum/Nodes: No pathologically enlarged mediastinal or hila
lymph nodes. Please note that accurate exclusion of hilar adenopathy
is limited on noncontrast CT scans. Esophagus is unremarkable in
appearance. No axillary lymphadenopathy.

Lungs/Pleura: High-resolution images demonstrate no significant
regions of ground-glass attenuation, subpleural reticulation,
parenchymal banding, traction bronchiectasis or frank honeycombing
to indicate interstitial lung disease. Inspiratory and expiratory
imaging demonstrates moderate air trapping, indicative of small
airways disease. Several small pulmonary nodules are noted in the
right lung, unchanged in size, number and distribution to prior
study 03/13/2007, considered definitively benign. The largest of
these nodules measures 7 mm in the anterior aspect of the right
lower lobe associated with the major fissure (image 71 of series 5),
compatible with a subpleural lymph node. No other new suspicious
appearing pulmonary nodules or masses are identified. Thin-walled
cyst in the right lower lobe incidentally noted.

Upper Abdomen: Aortic atherosclerosis.

Musculoskeletal: There are no aggressive appearing lytic or blastic
lesions noted in the visualized portions of the skeleton.
IMPRESSION: 1. No evidence of interstitial lung disease.
2. No change in several small pulmonary nodules in the right lung,
all of which are unchanged compared to prior study from 03/13/2007,
and subpleural in location, considered definitively benign
(presumably subpleural lymph nodes).
3. Aortic atherosclerosis, in addition to left main and 3 vessel
coronary artery disease. Please note that although the presence of
coronary artery calcium documents the presence of coronary artery
disease, the severity of this disease and any potential stenosis
cannot be assessed on this non-gated CT examination. Assessment for
potential risk factor modification, dietary therapy or pharmacologic
therapy may be warranted, if clinically indicated.
4. There are calcifications of the aortic valve. Echocardiographic
correlation for evaluation of potential valvular dysfunction may be
warranted if clinically indicated.

## 2018-08-30 DIAGNOSIS — E669 Obesity, unspecified: Secondary | ICD-10-CM | POA: Diagnosis not present

## 2018-08-30 DIAGNOSIS — E782 Mixed hyperlipidemia: Secondary | ICD-10-CM | POA: Diagnosis not present

## 2018-08-30 DIAGNOSIS — I119 Hypertensive heart disease without heart failure: Secondary | ICD-10-CM | POA: Diagnosis not present

## 2018-08-30 DIAGNOSIS — R7303 Prediabetes: Secondary | ICD-10-CM | POA: Diagnosis not present

## 2018-08-30 DIAGNOSIS — I2510 Atherosclerotic heart disease of native coronary artery without angina pectoris: Secondary | ICD-10-CM | POA: Diagnosis not present

## 2018-08-30 DIAGNOSIS — Z7189 Other specified counseling: Secondary | ICD-10-CM | POA: Diagnosis not present

## 2018-08-30 DIAGNOSIS — Z6831 Body mass index (BMI) 31.0-31.9, adult: Secondary | ICD-10-CM | POA: Diagnosis not present

## 2018-09-07 DIAGNOSIS — R7303 Prediabetes: Secondary | ICD-10-CM | POA: Diagnosis not present

## 2018-09-07 DIAGNOSIS — I119 Hypertensive heart disease without heart failure: Secondary | ICD-10-CM | POA: Diagnosis not present

## 2018-09-07 DIAGNOSIS — E782 Mixed hyperlipidemia: Secondary | ICD-10-CM | POA: Diagnosis not present

## 2018-11-02 ENCOUNTER — Ambulatory Visit (INDEPENDENT_AMBULATORY_CARE_PROVIDER_SITE_OTHER): Payer: Medicare Other | Admitting: Pharmacist

## 2018-11-02 ENCOUNTER — Other Ambulatory Visit: Payer: Self-pay

## 2018-11-02 DIAGNOSIS — T466X5A Adverse effect of antihyperlipidemic and antiarteriosclerotic drugs, initial encounter: Secondary | ICD-10-CM | POA: Diagnosis not present

## 2018-11-02 DIAGNOSIS — G720 Drug-induced myopathy: Secondary | ICD-10-CM

## 2018-11-02 DIAGNOSIS — E7800 Pure hypercholesterolemia, unspecified: Secondary | ICD-10-CM | POA: Diagnosis not present

## 2018-11-02 MED ORDER — REPATHA SURECLICK 140 MG/ML ~~LOC~~ SOAJ: 1.0000 "pen " | SUBCUTANEOUS | 11 refills | Status: DC

## 2018-11-02 NOTE — Progress Notes (Addendum)
Patient ID: Caleb Anderson                 DOB: 09-18-1945                    MRN: [id number]     HPI: Caleb Anderson is a 73 y.o. male patient referred to lipid clinic by Dr. Radford Pax. PMH is significant for HTN, HLD, and coronary artery calcifications of the left main, left anterior descending, left circumflex, and right coronary arteries on chest CT consistent with 3 vessel CAD. He was previously seen in lipid clinic in 2017 and was approved for PCSK9i therapy, however he was unable to start due to cost. He has intolerances with 4 statins (atorvastatin, pravastatin, rosuvastatin, and simvastatin), Zetia, and Welchol, and presents to lipid clinic for further management.  Patient presents today in good spirits. He has tried 4 statins at lowest dose in addition to [place]. He experienced joint and muscle aches in addition to fatigue with each of these. He reports symptoms would start 1-4 weeks after drug initiation. Symptoms would resolve a few days after medication discontinuation.  He was previously in the [facility] study last year. He does not want to participate in another trial because he is frustrated he never heard any follow up on his lab work or which treatment group he was in during previous study.  Current Medications: none Intolerances: atorvastatin 10mg  daily, pravastatin 10mg  daily, rosuvastatin 10mg  once a week, simvastatin - joint pain and fatigue. Welchol - myalgias. Zetia - joint pain and fatigue. Risk Factors: 3 vessel CAD per CT in 10/2015 LDL goal: 70mg /dL  Diet: Oatmeal bar and coffee for breakfast. Lunch - sandwich or salad with chicken. Dinner - pizza, BBQ, chicken and vegetables  Exercise: Walks 1 mile daily and plays golf.  Family History: Brother with heart disease and MI. Mother and sister with COPD. Father with kidney failure.  Social History: Patient was a former smoker of 1PPD for 15 years. Quit in 1992. Drinks alcohol occasionally, denies illicit drug  use.  Labs: 09/07/18: TC 252, TG 216, HDL 46, LDL 163, nonHDL 206 (no therapy) 11/26/2015: TC 269, TG 257, HDL 43, LDL 175 (no therapy)  Past Medical History:  Diagnosis Date  . Asbestosis ([place])   . Bradycardia 11/26/2015  . Coronary artery disease    coronary artery calcifications by chest CT with normal nuclear stress test 2013  . GERD (gastroesophageal reflux disease)   . Glaucoma   . Hyperlipidemia   . Hypertension     Current Outpatient Medications on File Prior to Visit  Medication Sig Dispense Refill  . aspirin 81 MG tablet Take 81 mg by mouth daily.    . Dorzolamide HCl-Timolol Mal PF 22.3-6.8 MG/ML SOLN Place 1 drop into both eyes 2 (two) times daily.     Marland Kitchen latanoprost (XALATAN) 0.005 % ophthalmic solution Place 1 drop into both eyes at bedtime.     Marland Kitchen lisinopril (PRINIVIL,ZESTRIL) 20 MG tablet Take 20 mg by mouth daily.    . meloxicam (MOBIC) 15 MG tablet Take 15 mg by mouth daily.    . Omega-3 Fatty Acids (FISH OIL) 1000 MG CAPS Take 2,000 mg by mouth daily.     No current facility-administered medications on file prior to visit.     Allergies  Allergen Reactions  . Fish Oil     Weight gain   . Statins Other (See Comments)    Joint pain and fatigue(Lipitor, crestor,  simvastatin, and pravastatin)  . Trilipix [Choline Fenofibrate] Other (See Comments)    Joint pain/fatigue  . Welchol [Colesevelam Hcl]     Aches   . Zetia [Ezetimibe] Other (See Comments)    Joint pain/fatigue    Assessment/Plan:  1. Hyperlipidemia - LDL most recently 163mg /dL at PCP above goal 70mg /dL given 3 vessel CAD. Pt is intolerant to 4 statins, Zetia, and Welchol. PCSK9i is the only option to bring LDL to goal. Discussed expected benefits, side effects, and injection technique with patient. Submitted Repatha prior authorization which was approved. Also sent pt to scheduling to reestablish with Dr Radford Pax as he has not been seen since 2017.   Vinnie Bobst E. Raine Elsass, PharmD, [place] [address], [place], [place] [address] Phone: [phone]; Fax: [phone] 11/02/2018 7:06 AM  Addendum: Called pharmacy to follow up with cost, copay is $472 - pt either has a deductible or is in the donut hole now (pharmacy states his eye drops are branded however pt stated he wasn't paying more than $20 for them). Unfortunately he will not qualify for patient assistance as his household income is over the [employer] cutoff. Advised pt to look for alternative insurance plan for next year that does not have a deductible. Can try rerunning rx at that time.

## 2018-11-02 NOTE — Patient Instructions (Addendum)
It was nice to see you today  I will start paperwork for Repatha injections for your cholesterol. This medication is an injection that you will give every 2 weeks into the fatty tissue in your stomach .Keep the medicine in the fridge until you are ready to give a shot. It will lower your LDL by 60%. We will plan to recheck your cholesterol 2 months after starting the injections  Call Tesia Lybrand, Pharmacist with any questions [phone]  Schedule annual visit with Dr Radford Pax - you have not seen her since 2017

## 2018-11-10 DIAGNOSIS — H401131 Primary open-angle glaucoma, bilateral, mild stage: Secondary | ICD-10-CM | POA: Diagnosis not present

## 2018-11-10 DIAGNOSIS — H5213 Myopia, bilateral: Secondary | ICD-10-CM | POA: Diagnosis not present

## 2018-11-14 ENCOUNTER — Ambulatory Visit: Payer: Medicare Other | Admitting: Cardiology

## 2018-12-02 DIAGNOSIS — Z23 Encounter for immunization: Secondary | ICD-10-CM | POA: Diagnosis not present

## 2018-12-25 ENCOUNTER — Ambulatory Visit: Payer: Medicare Other | Admitting: Cardiology

## 2019-03-28 ENCOUNTER — Telehealth: Payer: Self-pay | Admitting: Pharmacist

## 2019-03-28 NOTE — Telephone Encounter (Signed)
Called pt to see if he had changed insurances for the 2021 year - Repatha was previously cost prohibitive last year due to $435 deductible. He still has the same [facility] Part D plan this year and will likely have a similar deductible. Discussed that his first month copay would be around $480 but that follow up copays would be about $45. He does not qualify for pt assistance. Pt thinks this might be doable - he thought that every copay would be $480.  He states he is seeing his PCP soon and will have his cholesterol rechecked. He will call clinic if he decides he would like to pursue Repatha injections.

## 2019-04-05 DIAGNOSIS — E782 Mixed hyperlipidemia: Secondary | ICD-10-CM | POA: Diagnosis not present

## 2019-04-05 DIAGNOSIS — Z125 Encounter for screening for malignant neoplasm of prostate: Secondary | ICD-10-CM | POA: Diagnosis not present

## 2019-04-05 DIAGNOSIS — I119 Hypertensive heart disease without heart failure: Secondary | ICD-10-CM | POA: Diagnosis not present

## 2019-04-05 DIAGNOSIS — R7303 Prediabetes: Secondary | ICD-10-CM | POA: Diagnosis not present

## 2019-04-18 ENCOUNTER — Ambulatory Visit: Payer: Medicare Other

## 2019-04-26 ENCOUNTER — Ambulatory Visit: Payer: Medicare Other | Attending: Internal Medicine

## 2019-04-26 DIAGNOSIS — Z23 Encounter for immunization: Secondary | ICD-10-CM | POA: Insufficient documentation

## 2019-04-26 NOTE — Progress Notes (Signed)
   Covid-19 Vaccination Clinic  Name:  Caleb Anderson    MRN: [id number] DOB: Jun 30, 1945  04/26/2019  [name] was observed post Covid-19 immunization for 15 minutes without incidence. He was provided with Vaccine Information Sheet and instruction to access the V-Safe system.   [name] was instructed to call 911 with any severe reactions post vaccine: Marland Kitchen Difficulty breathing  . Swelling of your face and throat  . A fast heartbeat  . A bad rash all over your body  . Dizziness and weakness    Immunizations Administered    Name Date Dose VIS Date Route   Pfizer COVID-19 Vaccine 04/26/2019  6:12 PM 0.3 mL 03/02/2019 Intramuscular   Manufacturer: [place]   Lot: [id number]   [place]: [id number]

## 2019-05-09 ENCOUNTER — Ambulatory Visit: Payer: Medicare Other

## 2019-05-11 DIAGNOSIS — H5213 Myopia, bilateral: Secondary | ICD-10-CM | POA: Diagnosis not present

## 2019-05-11 DIAGNOSIS — H401131 Primary open-angle glaucoma, bilateral, mild stage: Secondary | ICD-10-CM | POA: Diagnosis not present

## 2019-05-11 DIAGNOSIS — H2513 Age-related nuclear cataract, bilateral: Secondary | ICD-10-CM | POA: Diagnosis not present

## 2019-05-11 DIAGNOSIS — H04123 Dry eye syndrome of bilateral lacrimal glands: Secondary | ICD-10-CM | POA: Diagnosis not present

## 2019-05-22 ENCOUNTER — Ambulatory Visit: Payer: Medicare Other | Attending: Internal Medicine

## 2019-05-22 DIAGNOSIS — Z23 Encounter for immunization: Secondary | ICD-10-CM | POA: Insufficient documentation

## 2019-05-22 NOTE — Progress Notes (Signed)
   Covid-19 Vaccination Clinic  Name:  Caleb Anderson    MRN: [id number] DOB: 12/27/1945  05/22/2019  [name] was observed post Covid-19 immunization for 15 minutes without incident. He was provided with Vaccine Information Sheet and instruction to access the V-Safe system.   [name] was instructed to call 911 with any severe reactions post vaccine: Marland Kitchen Difficulty breathing  . Swelling of face and throat  . A fast heartbeat  . A bad rash all over body  . Dizziness and weakness   Immunizations Administered    Name Date Dose VIS Date Route   Pfizer COVID-19 Vaccine 05/22/2019  8:35 AM 0.3 mL 03/02/2019 Intramuscular   Manufacturer: [place]   Lot: [id number]   [place]: [id number]

## 2019-06-08 DIAGNOSIS — H0100B Unspecified blepharitis left eye, upper and lower eyelids: Secondary | ICD-10-CM | POA: Diagnosis not present

## 2019-06-08 DIAGNOSIS — H401131 Primary open-angle glaucoma, bilateral, mild stage: Secondary | ICD-10-CM | POA: Diagnosis not present

## 2019-06-08 DIAGNOSIS — H2513 Age-related nuclear cataract, bilateral: Secondary | ICD-10-CM | POA: Diagnosis not present

## 2019-06-08 DIAGNOSIS — H0100A Unspecified blepharitis right eye, upper and lower eyelids: Secondary | ICD-10-CM | POA: Diagnosis not present

## 2019-07-12 DIAGNOSIS — H401122 Primary open-angle glaucoma, left eye, moderate stage: Secondary | ICD-10-CM | POA: Diagnosis not present

## 2019-07-12 DIAGNOSIS — H401120 Primary open-angle glaucoma, left eye, stage unspecified: Secondary | ICD-10-CM | POA: Diagnosis not present

## 2019-07-12 DIAGNOSIS — H40032 Anatomical narrow angle, left eye: Secondary | ICD-10-CM | POA: Diagnosis not present

## 2019-07-12 DIAGNOSIS — H2512 Age-related nuclear cataract, left eye: Secondary | ICD-10-CM | POA: Diagnosis not present

## 2019-07-12 DIAGNOSIS — H52202 Unspecified astigmatism, left eye: Secondary | ICD-10-CM | POA: Diagnosis not present

## 2019-07-31 DIAGNOSIS — R5383 Other fatigue: Secondary | ICD-10-CM | POA: Diagnosis not present

## 2019-07-31 DIAGNOSIS — R29898 Other symptoms and signs involving the musculoskeletal system: Secondary | ICD-10-CM | POA: Diagnosis not present

## 2019-08-14 DIAGNOSIS — H6991 Unspecified Eustachian tube disorder, right ear: Secondary | ICD-10-CM | POA: Diagnosis not present

## 2019-10-04 DIAGNOSIS — H401120 Primary open-angle glaucoma, left eye, stage unspecified: Secondary | ICD-10-CM | POA: Diagnosis not present

## 2019-10-04 DIAGNOSIS — H2511 Age-related nuclear cataract, right eye: Secondary | ICD-10-CM | POA: Diagnosis not present

## 2019-10-04 DIAGNOSIS — H409 Unspecified glaucoma: Secondary | ICD-10-CM | POA: Diagnosis not present

## 2019-12-29 DIAGNOSIS — Z23 Encounter for immunization: Secondary | ICD-10-CM | POA: Diagnosis not present

## 2020-01-14 DIAGNOSIS — H401122 Primary open-angle glaucoma, left eye, moderate stage: Secondary | ICD-10-CM | POA: Diagnosis not present

## 2020-03-31 DIAGNOSIS — Z0000 Encounter for general adult medical examination without abnormal findings: Secondary | ICD-10-CM | POA: Diagnosis not present

## 2020-03-31 DIAGNOSIS — K219 Gastro-esophageal reflux disease without esophagitis: Secondary | ICD-10-CM | POA: Diagnosis not present

## 2020-03-31 DIAGNOSIS — D692 Other nonthrombocytopenic purpura: Secondary | ICD-10-CM | POA: Diagnosis not present

## 2020-03-31 DIAGNOSIS — M1990 Unspecified osteoarthritis, unspecified site: Secondary | ICD-10-CM | POA: Diagnosis not present

## 2020-03-31 DIAGNOSIS — E669 Obesity, unspecified: Secondary | ICD-10-CM | POA: Diagnosis not present

## 2020-03-31 DIAGNOSIS — Z125 Encounter for screening for malignant neoplasm of prostate: Secondary | ICD-10-CM | POA: Diagnosis not present

## 2020-03-31 DIAGNOSIS — E782 Mixed hyperlipidemia: Secondary | ICD-10-CM | POA: Diagnosis not present

## 2020-03-31 DIAGNOSIS — I2510 Atherosclerotic heart disease of native coronary artery without angina pectoris: Secondary | ICD-10-CM | POA: Diagnosis not present

## 2020-03-31 DIAGNOSIS — N529 Male erectile dysfunction, unspecified: Secondary | ICD-10-CM | POA: Diagnosis not present

## 2020-03-31 DIAGNOSIS — H409 Unspecified glaucoma: Secondary | ICD-10-CM | POA: Diagnosis not present

## 2020-03-31 DIAGNOSIS — I119 Hypertensive heart disease without heart failure: Secondary | ICD-10-CM | POA: Diagnosis not present

## 2020-03-31 DIAGNOSIS — R7303 Prediabetes: Secondary | ICD-10-CM | POA: Diagnosis not present

## 2020-04-29 DIAGNOSIS — H0100A Unspecified blepharitis right eye, upper and lower eyelids: Secondary | ICD-10-CM | POA: Diagnosis not present

## 2020-04-29 DIAGNOSIS — H00015 Hordeolum externum left lower eyelid: Secondary | ICD-10-CM | POA: Diagnosis not present

## 2020-04-29 DIAGNOSIS — H0100B Unspecified blepharitis left eye, upper and lower eyelids: Secondary | ICD-10-CM | POA: Diagnosis not present

## 2020-05-02 DIAGNOSIS — H00015 Hordeolum externum left lower eyelid: Secondary | ICD-10-CM | POA: Diagnosis not present

## 2020-05-02 DIAGNOSIS — H401122 Primary open-angle glaucoma, left eye, moderate stage: Secondary | ICD-10-CM | POA: Diagnosis not present

## 2020-05-02 DIAGNOSIS — H01001 Unspecified blepharitis right upper eyelid: Secondary | ICD-10-CM | POA: Diagnosis not present

## 2020-05-02 DIAGNOSIS — H01004 Unspecified blepharitis left upper eyelid: Secondary | ICD-10-CM | POA: Diagnosis not present

## 2020-06-02 DIAGNOSIS — H401122 Primary open-angle glaucoma, left eye, moderate stage: Secondary | ICD-10-CM | POA: Diagnosis not present

## 2020-06-12 DIAGNOSIS — H401122 Primary open-angle glaucoma, left eye, moderate stage: Secondary | ICD-10-CM | POA: Diagnosis not present

## 2020-09-29 DIAGNOSIS — R7303 Prediabetes: Secondary | ICD-10-CM | POA: Diagnosis not present

## 2020-09-29 DIAGNOSIS — E782 Mixed hyperlipidemia: Secondary | ICD-10-CM | POA: Diagnosis not present

## 2020-09-29 DIAGNOSIS — I2510 Atherosclerotic heart disease of native coronary artery without angina pectoris: Secondary | ICD-10-CM | POA: Diagnosis not present

## 2020-09-29 DIAGNOSIS — I119 Hypertensive heart disease without heart failure: Secondary | ICD-10-CM | POA: Diagnosis not present

## 2020-12-06 DIAGNOSIS — Z23 Encounter for immunization: Secondary | ICD-10-CM | POA: Diagnosis not present

## 2020-12-15 DIAGNOSIS — H04123 Dry eye syndrome of bilateral lacrimal glands: Secondary | ICD-10-CM | POA: Diagnosis not present

## 2020-12-15 DIAGNOSIS — Z961 Presence of intraocular lens: Secondary | ICD-10-CM | POA: Diagnosis not present

## 2020-12-15 DIAGNOSIS — H401122 Primary open-angle glaucoma, left eye, moderate stage: Secondary | ICD-10-CM | POA: Diagnosis not present

## 2020-12-15 DIAGNOSIS — H26492 Other secondary cataract, left eye: Secondary | ICD-10-CM | POA: Diagnosis not present

## 2021-04-09 DIAGNOSIS — H409 Unspecified glaucoma: Secondary | ICD-10-CM | POA: Diagnosis not present

## 2021-04-09 DIAGNOSIS — K219 Gastro-esophageal reflux disease without esophagitis: Secondary | ICD-10-CM | POA: Diagnosis not present

## 2021-04-09 DIAGNOSIS — Z0000 Encounter for general adult medical examination without abnormal findings: Secondary | ICD-10-CM | POA: Diagnosis not present

## 2021-04-09 DIAGNOSIS — I2510 Atherosclerotic heart disease of native coronary artery without angina pectoris: Secondary | ICD-10-CM | POA: Diagnosis not present

## 2021-04-09 DIAGNOSIS — R7303 Prediabetes: Secondary | ICD-10-CM | POA: Diagnosis not present

## 2021-04-09 DIAGNOSIS — E782 Mixed hyperlipidemia: Secondary | ICD-10-CM | POA: Diagnosis not present

## 2021-04-09 DIAGNOSIS — D692 Other nonthrombocytopenic purpura: Secondary | ICD-10-CM | POA: Diagnosis not present

## 2021-04-09 DIAGNOSIS — M1990 Unspecified osteoarthritis, unspecified site: Secondary | ICD-10-CM | POA: Diagnosis not present

## 2021-04-09 DIAGNOSIS — E669 Obesity, unspecified: Secondary | ICD-10-CM | POA: Diagnosis not present

## 2021-04-09 DIAGNOSIS — I119 Hypertensive heart disease without heart failure: Secondary | ICD-10-CM | POA: Diagnosis not present

## 2021-04-09 DIAGNOSIS — N529 Male erectile dysfunction, unspecified: Secondary | ICD-10-CM | POA: Diagnosis not present

## 2021-04-09 DIAGNOSIS — Z125 Encounter for screening for malignant neoplasm of prostate: Secondary | ICD-10-CM | POA: Diagnosis not present

## 2021-04-16 DIAGNOSIS — I2510 Atherosclerotic heart disease of native coronary artery without angina pectoris: Secondary | ICD-10-CM | POA: Diagnosis not present

## 2021-04-16 DIAGNOSIS — U071 COVID-19: Secondary | ICD-10-CM | POA: Diagnosis not present

## 2021-04-16 DIAGNOSIS — I119 Hypertensive heart disease without heart failure: Secondary | ICD-10-CM | POA: Diagnosis not present

## 2021-04-16 DIAGNOSIS — R7303 Prediabetes: Secondary | ICD-10-CM | POA: Diagnosis not present

## 2021-05-04 DIAGNOSIS — H401122 Primary open-angle glaucoma, left eye, moderate stage: Secondary | ICD-10-CM | POA: Diagnosis not present

## 2021-05-04 DIAGNOSIS — Z961 Presence of intraocular lens: Secondary | ICD-10-CM | POA: Diagnosis not present

## 2021-05-04 DIAGNOSIS — H26493 Other secondary cataract, bilateral: Secondary | ICD-10-CM | POA: Diagnosis not present

## 2021-05-11 DIAGNOSIS — N179 Acute kidney failure, unspecified: Secondary | ICD-10-CM | POA: Diagnosis not present

## 2021-10-16 DIAGNOSIS — Z23 Encounter for immunization: Secondary | ICD-10-CM | POA: Diagnosis not present

## 2021-10-16 DIAGNOSIS — R7303 Prediabetes: Secondary | ICD-10-CM | POA: Diagnosis not present

## 2021-10-16 DIAGNOSIS — E669 Obesity, unspecified: Secondary | ICD-10-CM | POA: Diagnosis not present

## 2021-10-16 DIAGNOSIS — I119 Hypertensive heart disease without heart failure: Secondary | ICD-10-CM | POA: Diagnosis not present

## 2021-10-16 DIAGNOSIS — I2510 Atherosclerotic heart disease of native coronary artery without angina pectoris: Secondary | ICD-10-CM | POA: Diagnosis not present

## 2021-10-16 DIAGNOSIS — R350 Frequency of micturition: Secondary | ICD-10-CM | POA: Diagnosis not present

## 2021-10-16 DIAGNOSIS — E782 Mixed hyperlipidemia: Secondary | ICD-10-CM | POA: Diagnosis not present

## 2021-10-26 DIAGNOSIS — K5730 Diverticulosis of large intestine without perforation or abscess without bleeding: Secondary | ICD-10-CM | POA: Diagnosis not present

## 2021-10-26 DIAGNOSIS — Z86010 Personal history of colonic polyps: Secondary | ICD-10-CM | POA: Diagnosis not present

## 2021-10-26 DIAGNOSIS — Z09 Encounter for follow-up examination after completed treatment for conditions other than malignant neoplasm: Secondary | ICD-10-CM | POA: Diagnosis not present

## 2021-10-26 DIAGNOSIS — K649 Unspecified hemorrhoids: Secondary | ICD-10-CM | POA: Diagnosis not present

## 2021-11-02 DIAGNOSIS — H401122 Primary open-angle glaucoma, left eye, moderate stage: Secondary | ICD-10-CM | POA: Diagnosis not present

## 2021-11-02 DIAGNOSIS — Z961 Presence of intraocular lens: Secondary | ICD-10-CM | POA: Diagnosis not present

## 2021-11-02 DIAGNOSIS — H26493 Other secondary cataract, bilateral: Secondary | ICD-10-CM | POA: Diagnosis not present

## 2021-11-05 DIAGNOSIS — H26491 Other secondary cataract, right eye: Secondary | ICD-10-CM | POA: Diagnosis not present

## 2021-11-05 DIAGNOSIS — H26492 Other secondary cataract, left eye: Secondary | ICD-10-CM | POA: Diagnosis not present

## 2021-12-19 DIAGNOSIS — Z23 Encounter for immunization: Secondary | ICD-10-CM | POA: Diagnosis not present

## 2021-12-27 ENCOUNTER — Other Ambulatory Visit: Payer: Self-pay

## 2021-12-27 ENCOUNTER — Encounter (HOSPITAL_COMMUNITY): Payer: Self-pay | Admitting: Emergency Medicine

## 2021-12-27 ENCOUNTER — Emergency Department (HOSPITAL_COMMUNITY): Payer: Medicare Other

## 2021-12-27 ENCOUNTER — Inpatient Hospital Stay (HOSPITAL_COMMUNITY)
Admission: EM | Admit: 2021-12-27 | Discharge: 2021-12-30 | DRG: 324 | Disposition: A | Discharge status: Home / Self Care | Payer: Medicare Other | Attending: Cardiology | Admitting: Cardiology

## 2021-12-27 DIAGNOSIS — D649 Anemia, unspecified: Secondary | ICD-10-CM | POA: Diagnosis not present

## 2021-12-27 DIAGNOSIS — H409 Unspecified glaucoma: Secondary | ICD-10-CM | POA: Diagnosis present

## 2021-12-27 DIAGNOSIS — Z79899 Other long term (current) drug therapy: Secondary | ICD-10-CM | POA: Diagnosis not present

## 2021-12-27 DIAGNOSIS — H109 Unspecified conjunctivitis: Secondary | ICD-10-CM | POA: Diagnosis present

## 2021-12-27 DIAGNOSIS — I10 Essential (primary) hypertension: Secondary | ICD-10-CM | POA: Diagnosis not present

## 2021-12-27 DIAGNOSIS — I499 Cardiac arrhythmia, unspecified: Secondary | ICD-10-CM | POA: Diagnosis not present

## 2021-12-27 DIAGNOSIS — I480 Paroxysmal atrial fibrillation: Secondary | ICD-10-CM | POA: Diagnosis not present

## 2021-12-27 DIAGNOSIS — Z87891 Personal history of nicotine dependence: Secondary | ICD-10-CM | POA: Diagnosis not present

## 2021-12-27 DIAGNOSIS — I214 Non-ST elevation (NSTEMI) myocardial infarction: Principal | ICD-10-CM | POA: Diagnosis present

## 2021-12-27 DIAGNOSIS — K219 Gastro-esophageal reflux disease without esophagitis: Secondary | ICD-10-CM | POA: Diagnosis present

## 2021-12-27 DIAGNOSIS — Z955 Presence of coronary angioplasty implant and graft: Secondary | ICD-10-CM

## 2021-12-27 DIAGNOSIS — M1990 Unspecified osteoarthritis, unspecified site: Secondary | ICD-10-CM | POA: Diagnosis present

## 2021-12-27 DIAGNOSIS — E876 Hypokalemia: Secondary | ICD-10-CM | POA: Diagnosis not present

## 2021-12-27 DIAGNOSIS — I4891 Unspecified atrial fibrillation: Secondary | ICD-10-CM | POA: Diagnosis not present

## 2021-12-27 DIAGNOSIS — R001 Bradycardia, unspecified: Secondary | ICD-10-CM | POA: Diagnosis not present

## 2021-12-27 DIAGNOSIS — E7800 Pure hypercholesterolemia, unspecified: Secondary | ICD-10-CM | POA: Diagnosis not present

## 2021-12-27 DIAGNOSIS — Z888 Allergy status to other drugs, medicaments and biological substances status: Secondary | ICD-10-CM | POA: Diagnosis not present

## 2021-12-27 DIAGNOSIS — Z7982 Long term (current) use of aspirin: Secondary | ICD-10-CM

## 2021-12-27 DIAGNOSIS — N400 Enlarged prostate without lower urinary tract symptoms: Secondary | ICD-10-CM | POA: Diagnosis present

## 2021-12-27 DIAGNOSIS — I77810 Thoracic aortic ectasia: Secondary | ICD-10-CM | POA: Diagnosis not present

## 2021-12-27 DIAGNOSIS — I700 Atherosclerosis of aorta: Secondary | ICD-10-CM | POA: Diagnosis not present

## 2021-12-27 DIAGNOSIS — R002 Palpitations: Secondary | ICD-10-CM | POA: Diagnosis not present

## 2021-12-27 DIAGNOSIS — Z7901 Long term (current) use of anticoagulants: Secondary | ICD-10-CM | POA: Diagnosis not present

## 2021-12-27 DIAGNOSIS — R000 Tachycardia, unspecified: Secondary | ICD-10-CM | POA: Diagnosis not present

## 2021-12-27 DIAGNOSIS — Z8249 Family history of ischemic heart disease and other diseases of the circulatory system: Secondary | ICD-10-CM | POA: Diagnosis not present

## 2021-12-27 DIAGNOSIS — I2510 Atherosclerotic heart disease of native coronary artery without angina pectoris: Secondary | ICD-10-CM | POA: Diagnosis not present

## 2021-12-27 DIAGNOSIS — R079 Chest pain, unspecified: Secondary | ICD-10-CM | POA: Diagnosis not present

## 2021-12-27 NOTE — ED Provider Notes (Signed)
[facility] EMERGENCY DEPARTMENT Provider Note   CSN: [id number] Arrival date & time: 12/27/21  2307     History {Add pertinent medical, surgical, social history, OB history to HPI:1} Chief Complaint  Patient presents with   Atrial Fibrillation    Caleb Anderson is a 76 y.o. male.   Atrial Fibrillation  Caleb Anderson is a 76 y.o. male who presents to the Emergency Department complaining of chest pain.  He presents to the emergency department for evaluation of chest pain.  He states that he was in his routine state of health when around 10 PM he was watching TV and developed a discomfort and pressure in his chest that radiated up to his right neck.  He felt off and not well.  He states that his heart rate felt fast and irregular.  He presented to the fire department for evaluation and there was found to be in A-fib with RVR with heart rates 150s to 200s.  This was on the monitor and they were unable to capture twelve-lead EKG prior to him spontaneously converting.  After converting he states that he feels well.  He has a history of hypertension, hyperlipidemia, arthritis.  He did have a dental extraction last week.  No prior similar symptoms.  No history of TIA/CVA.  No history of cardiac disease.  He is a former smoker.  Rare alcohol.  No drug use.     Home Medications Prior to Admission medications   Medication Sig Start Date End Date Taking? Authorizing Provider  aspirin 81 MG tablet Take 81 mg by mouth daily.    [provider]  Dorzolamide HCl-Timolol Mal PF 22.3-6.8 MG/ML SOLN Place 1 drop into both eyes 2 (two) times daily.     [provider]  latanoprost (XALATAN) 0.005 % ophthalmic solution Place 1 drop into both eyes at bedtime.  08/08/13   [provider]  lisinopril (PRINIVIL,ZESTRIL) 20 MG tablet Take 20 mg by mouth daily.    [provider]  meloxicam (MOBIC) 15 MG tablet Take 15 mg by mouth daily.    [provider]  Omega-3 Fatty Acids (FISH OIL) 1000 MG CAPS Take 2,000 mg by mouth daily.    [provider]      Allergies    Fish oil, Statins, Trilipix [choline fenofibrate], Welchol [colesevelam hcl], and Zetia [ezetimibe]    Review of Systems   Review of Systems  All other systems reviewed and are negative.   Physical Exam Updated Vital Signs BP (!) 170/85   Pulse 71   Temp 98.5 F (36.9 C)   Resp 20   SpO2 99%  Physical Exam Vitals and nursing note reviewed.  Constitutional:      Appearance: He is well-developed.  HENT:     Head: Normocephalic and atraumatic.  Cardiovascular:     Rate and Rhythm: Normal rate and regular rhythm.     Heart sounds: No murmur heard. Pulmonary:     Effort: Pulmonary effort is normal. No respiratory distress.     Breath sounds: Normal breath sounds.  Abdominal:     Palpations: Abdomen is soft.     Tenderness: There is no abdominal tenderness. There is no guarding or rebound.  Musculoskeletal:        General: No swelling or tenderness.  Skin:    General: Skin is warm and dry.  Neurological:     Mental Status: He is alert and oriented to person, place, and time.  Psychiatric:        Behavior: Behavior normal.     ED Results / Procedures / Treatments   Labs (all labs ordered are listed, but only abnormal results are displayed) Labs Reviewed  COMPREHENSIVE METABOLIC PANEL  CBC WITH DIFFERENTIAL/PLATELET  MAGNESIUM  TROPONIN I (HIGH SENSITIVITY)    EKG EKG Interpretation  Date/Time:  Sunday December 27 2021 23:12:15 EDT Ventricular Rate:  71 PR Interval:  149 QRS Duration: 86 QT Interval:  380 QTC Calculation: 413 R Axis:   -45 Text Interpretation: Sinus rhythm Left anterior fascicular block Borderline low voltage, extremity leads Confirmed by Quintella Reichert [phone]) on 12/27/2021 11:25:38 PM  Radiology No results found.  Procedures Procedures  {Document cardiac monitor, telemetry assessment procedure when  appropriate:1}  Medications Ordered in ED Medications - No data to display  ED Course/ Medical Decision Making/ A&P                           Medical Decision Making Amount and/or Complexity of Data Reviewed Labs: ordered. Radiology: ordered.   ***  {Document critical care time when appropriate:1} {Document review of labs and clinical decision tools ie heart score, Chads2Vasc2 etc:1}  {Document your independent review of radiology images, and any outside records:1} {Document your discussion with family members, caretakers, and with consultants:1} {Document social determinants of health affecting pt's care:1} {Document your decision making why or why not admission, treatments were needed:1} Final Clinical Impression(s) / ED Diagnoses Final diagnoses:  None    Rx / DC Orders ED Discharge Orders     None

## 2021-12-27 NOTE — ED Triage Notes (Signed)
Pt brought to ED by [facility] for further evaluation of new onset atrial fibrillation that self converted en route to facility. Pt reported palpitations and warm sensation in face and jaw, denies any chest pain. Initial assessment A-fib RVR with rate 150-200.   20G IV LFA  EMS Vitals BP 140/90 HR 80 SPO2 100%

## 2021-12-28 ENCOUNTER — Inpatient Hospital Stay (HOSPITAL_COMMUNITY): Payer: Medicare Other

## 2021-12-28 ENCOUNTER — Encounter (HOSPITAL_COMMUNITY): Payer: Self-pay | Admitting: Internal Medicine

## 2021-12-28 DIAGNOSIS — E876 Hypokalemia: Secondary | ICD-10-CM | POA: Diagnosis present

## 2021-12-28 DIAGNOSIS — H409 Unspecified glaucoma: Secondary | ICD-10-CM | POA: Diagnosis present

## 2021-12-28 DIAGNOSIS — I700 Atherosclerosis of aorta: Secondary | ICD-10-CM | POA: Diagnosis present

## 2021-12-28 DIAGNOSIS — Z888 Allergy status to other drugs, medicaments and biological substances status: Secondary | ICD-10-CM | POA: Diagnosis not present

## 2021-12-28 DIAGNOSIS — M1990 Unspecified osteoarthritis, unspecified site: Secondary | ICD-10-CM | POA: Diagnosis present

## 2021-12-28 DIAGNOSIS — Z7982 Long term (current) use of aspirin: Secondary | ICD-10-CM | POA: Diagnosis not present

## 2021-12-28 DIAGNOSIS — Z79899 Other long term (current) drug therapy: Secondary | ICD-10-CM | POA: Diagnosis not present

## 2021-12-28 DIAGNOSIS — I4891 Unspecified atrial fibrillation: Secondary | ICD-10-CM | POA: Diagnosis present

## 2021-12-28 DIAGNOSIS — Z8249 Family history of ischemic heart disease and other diseases of the circulatory system: Secondary | ICD-10-CM | POA: Diagnosis not present

## 2021-12-28 DIAGNOSIS — I77810 Thoracic aortic ectasia: Secondary | ICD-10-CM | POA: Diagnosis present

## 2021-12-28 DIAGNOSIS — N400 Enlarged prostate without lower urinary tract symptoms: Secondary | ICD-10-CM | POA: Diagnosis present

## 2021-12-28 DIAGNOSIS — H109 Unspecified conjunctivitis: Secondary | ICD-10-CM | POA: Diagnosis present

## 2021-12-28 DIAGNOSIS — I10 Essential (primary) hypertension: Secondary | ICD-10-CM | POA: Diagnosis present

## 2021-12-28 DIAGNOSIS — D649 Anemia, unspecified: Secondary | ICD-10-CM | POA: Diagnosis present

## 2021-12-28 DIAGNOSIS — I480 Paroxysmal atrial fibrillation: Secondary | ICD-10-CM | POA: Diagnosis present

## 2021-12-28 DIAGNOSIS — K219 Gastro-esophageal reflux disease without esophagitis: Secondary | ICD-10-CM | POA: Diagnosis present

## 2021-12-28 DIAGNOSIS — E7800 Pure hypercholesterolemia, unspecified: Secondary | ICD-10-CM | POA: Diagnosis present

## 2021-12-28 DIAGNOSIS — I214 Non-ST elevation (NSTEMI) myocardial infarction: Principal | ICD-10-CM | POA: Diagnosis present

## 2021-12-28 DIAGNOSIS — Z7901 Long term (current) use of anticoagulants: Secondary | ICD-10-CM | POA: Diagnosis not present

## 2021-12-28 DIAGNOSIS — Z87891 Personal history of nicotine dependence: Secondary | ICD-10-CM | POA: Diagnosis not present

## 2021-12-28 DIAGNOSIS — I2510 Atherosclerotic heart disease of native coronary artery without angina pectoris: Secondary | ICD-10-CM | POA: Diagnosis present

## 2021-12-28 DIAGNOSIS — Z955 Presence of coronary angioplasty implant and graft: Secondary | ICD-10-CM | POA: Diagnosis not present

## 2021-12-28 DIAGNOSIS — R001 Bradycardia, unspecified: Secondary | ICD-10-CM | POA: Diagnosis not present

## 2021-12-28 LAB — ECHOCARDIOGRAM COMPLETE
Area-P 1/2: 3.77 cm2
S' Lateral: 2.9 cm

## 2021-12-28 LAB — CBC WITH DIFFERENTIAL/PLATELET
Abs Immature Granulocytes: 0.02 10*3/uL (ref 0.00–0.07)
Basophils Absolute: 0.1 10*3/uL (ref 0.0–0.1)
Basophils Relative: 1 %
Eosinophils Absolute: 0.3 10*3/uL (ref 0.0–0.5)
Eosinophils Relative: 4 %
HCT: 36.4 % — ABNORMAL LOW (ref 39.0–52.0)
Hemoglobin: 11.9 g/dL — ABNORMAL LOW (ref 13.0–17.0)
Immature Granulocytes: 0 %
Lymphocytes Relative: 41 %
Lymphs Abs: 2.6 10*3/uL (ref 0.7–4.0)
MCH: 28.3 pg (ref 26.0–34.0)
MCHC: 32.7 g/dL (ref 30.0–36.0)
MCV: 86.7 fL (ref 80.0–100.0)
Monocytes Absolute: 0.7 10*3/uL (ref 0.1–1.0)
Monocytes Relative: 11 %
Neutro Abs: 2.7 10*3/uL (ref 1.7–7.7)
Neutrophils Relative %: 43 %
Platelets: 194 10*3/uL (ref 150–400)
RBC: 4.2 MIL/uL — ABNORMAL LOW (ref 4.22–5.81)
RDW: 13 % (ref 11.5–15.5)
WBC: 6.3 10*3/uL (ref 4.0–10.5)
nRBC: 0 % (ref 0.0–0.2)

## 2021-12-28 LAB — TROPONIN I (HIGH SENSITIVITY)
Troponin I (High Sensitivity): 146 ng/L (ref <18)
Troponin I (High Sensitivity): 78 ng/L — ABNORMAL HIGH (ref <18)

## 2021-12-28 LAB — BASIC METABOLIC PANEL
Anion gap: 10 (ref 5–15)
BUN: 16 mg/dL (ref 8–23)
CO2: 24 mmol/L (ref 22–32)
Calcium: 8.7 mg/dL — ABNORMAL LOW (ref 8.9–10.3)
Chloride: 107 mmol/L (ref 98–111)
Creatinine, Ser: 0.99 mg/dL (ref 0.61–1.24)
GFR, Estimated: >60 mL/min (ref >60)
Glucose, Bld: 102 mg/dL — ABNORMAL HIGH (ref 70–99)
Potassium: 4.4 mmol/L (ref 3.5–5.1)
Sodium: 141 mmol/L (ref 135–145)

## 2021-12-28 LAB — TSH: TSH: 2.39 u[IU]/mL (ref 0.350–4.500)

## 2021-12-28 LAB — COMPREHENSIVE METABOLIC PANEL
ALT: 18 U/L (ref 0–44)
AST: 19 U/L (ref 15–41)
Albumin: 2.6 g/dL — ABNORMAL LOW (ref 3.5–5.0)
Alkaline Phosphatase: 49 U/L (ref 38–126)
Anion gap: 5 (ref 5–15)
BUN: 15 mg/dL (ref 8–23)
CO2: 20 mmol/L — ABNORMAL LOW (ref 22–32)
Calcium: 6.8 mg/dL — ABNORMAL LOW (ref 8.9–10.3)
Chloride: 117 mmol/L — ABNORMAL HIGH (ref 98–111)
Creatinine, Ser: 0.82 mg/dL (ref 0.61–1.24)
GFR, Estimated: >60 mL/min (ref >60)
Glucose, Bld: 92 mg/dL (ref 70–99)
Potassium: 2.9 mmol/L — ABNORMAL LOW (ref 3.5–5.1)
Sodium: 142 mmol/L (ref 135–145)
Total Bilirubin: 0.7 mg/dL (ref 0.3–1.2)
Total Protein: 4.7 g/dL — ABNORMAL LOW (ref 6.5–8.1)

## 2021-12-28 LAB — LIPID PANEL
Cholesterol: 173 mg/dL (ref 0–200)
HDL: 33 mg/dL — ABNORMAL LOW (ref >40)
LDL Cholesterol: 113 mg/dL — ABNORMAL HIGH (ref 0–99)
Total CHOL/HDL Ratio: 5.2 RATIO
Triglycerides: 133 mg/dL (ref <150)
VLDL: 27 mg/dL (ref 0–40)

## 2021-12-28 LAB — IRON AND TIBC
Iron: 62 ug/dL (ref 45–182)
Saturation Ratios: 21 % (ref 17.9–39.5)
TIBC: 298 ug/dL (ref 250–450)
UIBC: 236 ug/dL

## 2021-12-28 LAB — TRANSFERRIN: Transferrin: 208 mg/dL (ref 180–329)

## 2021-12-28 LAB — HEMOGLOBIN A1C
Hgb A1c MFr Bld: 5.8 % — ABNORMAL HIGH (ref 4.8–5.6)
Mean Plasma Glucose: 119.76 mg/dL

## 2021-12-28 LAB — MAGNESIUM: Magnesium: 1.7 mg/dL (ref 1.7–2.4)

## 2021-12-28 LAB — FERRITIN: Ferritin: 54 ng/mL (ref 24–336)

## 2021-12-28 LAB — BRAIN NATRIURETIC PEPTIDE: B Natriuretic Peptide: 92.2 pg/mL (ref 0.0–100.0)

## 2021-12-28 LAB — HEPARIN LEVEL (UNFRACTIONATED): Heparin Unfractionated: 0.37 IU/mL (ref 0.30–0.70)

## 2021-12-28 MED ORDER — ASPIRIN 81 MG PO CHEW: 81.0000 mg | CHEWABLE_TABLET | ORAL | Status: AC

## 2021-12-28 MED ORDER — ONDANSETRON HCL 4 MG/2ML IJ SOLN: 4.0000 mg | Freq: Four times a day (QID) | INTRAMUSCULAR | Status: DC | PRN

## 2021-12-28 MED ORDER — SODIUM CHLORIDE 0.9% FLUSH
3.0000 mL | Freq: Two times a day (BID) | INTRAVENOUS | Status: DC
  Administered 2021-12-28 – 2021-12-30 (×4): 3 mL via INTRAVENOUS

## 2021-12-28 MED ORDER — ASPIRIN 81 MG PO TABS: 81.0000 mg | ORAL_TABLET | Freq: Every day | ORAL | Status: DC

## 2021-12-28 MED ORDER — LISINOPRIL-HYDROCHLOROTHIAZIDE 20-12.5 MG PO TABS: 1.0000 | ORAL_TABLET | Freq: Every day | ORAL | Status: DC

## 2021-12-28 MED ORDER — HEPARIN (PORCINE) 25000 UT/250ML-% IV SOLN
1200.0000 [IU]/h | INTRAVENOUS | Status: DC
  Administered 2021-12-28: 1200 [IU]/h via INTRAVENOUS
  Filled 2021-12-28 (×2): qty 250

## 2021-12-28 MED ORDER — HYDROCHLOROTHIAZIDE 12.5 MG PO TABS: 12.5000 mg | ORAL_TABLET | Freq: Every day | ORAL | Status: DC

## 2021-12-28 MED ORDER — POTASSIUM CHLORIDE CRYS ER 20 MEQ PO TBCR
40.0000 meq | EXTENDED_RELEASE_TABLET | Freq: Once | ORAL | Status: AC
  Administered 2021-12-28: 40 meq via ORAL
  Filled 2021-12-28: qty 2

## 2021-12-28 MED ORDER — ASPIRIN 81 MG PO TBEC
81.0000 mg | DELAYED_RELEASE_TABLET | Freq: Every day | ORAL | Status: DC
  Filled 2021-12-28: qty 1

## 2021-12-28 MED ORDER — PERFLUTREN LIPID MICROSPHERE
1.0000 mL | INTRAVENOUS | Status: AC | PRN
  Administered 2021-12-28: 4 mL via INTRAVENOUS

## 2021-12-28 MED ORDER — HEPARIN BOLUS VIA INFUSION
5000.0000 [IU] | Freq: Once | INTRAVENOUS | Status: AC
  Administered 2021-12-28: 5000 [IU] via INTRAVENOUS
  Filled 2021-12-28: qty 5000

## 2021-12-28 MED ORDER — TAMSULOSIN HCL 0.4 MG PO CAPS
0.4000 mg | ORAL_CAPSULE | Freq: Every morning | ORAL | Status: DC
  Administered 2021-12-28 – 2021-12-30 (×3): 0.4 mg via ORAL
  Filled 2021-12-28 (×3): qty 1

## 2021-12-28 MED ORDER — NITROGLYCERIN 0.4 MG SL SUBL: 0.4000 mg | SUBLINGUAL_TABLET | SUBLINGUAL | Status: DC | PRN

## 2021-12-28 MED ORDER — ACETAMINOPHEN 325 MG PO TABS: 650.0000 mg | ORAL_TABLET | ORAL | Status: DC | PRN

## 2021-12-28 MED ORDER — DORZOLAMIDE HCL-TIMOLOL MAL 2-0.5 % OP SOLN
1.0000 [drp] | Freq: Every day | OPHTHALMIC | Status: DC
  Administered 2021-12-28 – 2021-12-29 (×2): 1 [drp] via OPHTHALMIC
  Filled 2021-12-28: qty 10

## 2021-12-28 MED ORDER — ASPIRIN 81 MG PO TBEC
81.0000 mg | DELAYED_RELEASE_TABLET | Freq: Every day | ORAL | Status: DC
  Administered 2021-12-28 – 2021-12-29 (×2): 81 mg via ORAL
  Filled 2021-12-28 (×3): qty 1

## 2021-12-28 MED ORDER — LISINOPRIL 20 MG PO TABS
20.0000 mg | ORAL_TABLET | Freq: Every day | ORAL | Status: DC
  Administered 2021-12-28 – 2021-12-30 (×3): 20 mg via ORAL
  Filled 2021-12-28 (×3): qty 1

## 2021-12-28 MED ORDER — PRAVASTATIN SODIUM 10 MG PO TABS
10.0000 mg | ORAL_TABLET | Freq: Every day | ORAL | Status: DC
  Administered 2021-12-28 – 2021-12-30 (×3): 10 mg via ORAL
  Filled 2021-12-28 (×3): qty 1

## 2021-12-28 MED ORDER — SODIUM CHLORIDE 0.9 % IV SOLN: INTRAVENOUS | Status: DC

## 2021-12-28 MED ORDER — ASPIRIN 81 MG PO CHEW: 81.0000 mg | CHEWABLE_TABLET | ORAL | Status: DC

## 2021-12-28 MED ORDER — ASPIRIN 81 MG PO CHEW
324.0000 mg | CHEWABLE_TABLET | Freq: Once | ORAL | Status: AC
  Administered 2021-12-28: 324 mg via ORAL
  Filled 2021-12-28: qty 4

## 2021-12-28 NOTE — Progress Notes (Signed)
ANTICOAGULATION CONSULT NOTE - Initial Consult  Pharmacy Consult for Heparin  Indication: atrial fibrillation  Allergies  Allergen Reactions   Fish Oil     Weight gain    Statins Other (See Comments)    Joint pain and fatigue(Lipitor, crestor, simvastatin, and pravastatin)   Trilipix [Choline Fenofibrate] Other (See Comments)    Joint pain/fatigue   Welchol [Colesevelam Hcl]     Aches    Zetia [Ezetimibe] Other (See Comments)    Joint pain/fatigue     Vital Signs: Temp: 98.5 F (36.9 C) (10/08 2313) BP: 134/86 (10/09 0400) Pulse Rate: 57 (10/09 0400)  Labs: Recent Labs    12/27/21 2340 12/28/21 0138  HGB 11.9*  --   HCT 36.4*  --   PLT 194  --   CREATININE 0.82  --   TROPONINIHS 78* 146*    CrCl cannot be calculated (Unknown ideal weight.).   Medical History: Past Medical History:  Diagnosis Date   Asbestosis ([place])    Bradycardia 11/26/2015   Coronary artery disease    coronary artery calcifications by chest CT with normal nuclear stress test 2013   GERD (gastroesophageal reflux disease)    Glaucoma    Hyperlipidemia    Hypertension      Assessment: 76 y/o M with new onset afib/RVR. Starting heparin. Hgb 11.9. Renal function good. PTA meds reviewed.   Goal of Therapy:  Heparin level 0.3-0.7 units/ml Monitor platelets by anticoagulation protocol: Yes   Plan:  Heparin 5000 units BOLUS Start heparin drip at 1200 units/hr 1500 Heparin level Daily CBC/Heparin level Monitor for bleeding  Narda Bonds, PharmD, BCPS Clinical Pharmacist Phone: [phone]

## 2021-12-28 NOTE — Progress Notes (Signed)
ANTICOAGULATION CONSULT NOTE   Pharmacy Consult for Heparin  Indication: atrial fibrillation  Allergies  Allergen Reactions   Fish Oil     Weight gain    Statins Other (See Comments)    Joint pain and fatigue(Lipitor, crestor, simvastatin, and pravastatin)   Trilipix [Choline Fenofibrate] Other (See Comments)    Joint pain/fatigue   Welchol [Colesevelam Hcl]     Aches    Zetia [Ezetimibe] Other (See Comments)    Joint pain/fatigue     Vital Signs: Temp: 98.3 F (36.8 C) (10/09 1259) Temp Source: Oral (10/09 1259) BP: 131/57 (10/09 1645) Pulse Rate: 64 (10/09 1645)  Labs: Recent Labs    12/27/21 2340 12/28/21 0138 12/28/21 1500 12/28/21 1612  HGB 11.9*  --   --   --   HCT 36.4*  --   --   --   PLT 194  --   --   --   HEPARINUNFRC  --   --  0.37  --   CREATININE 0.82  --   --  0.99  TROPONINIHS 78* 146*  --   --      CrCl cannot be calculated (Unknown ideal weight.).   Medical History: Past Medical History:  Diagnosis Date   Asbestosis ([place])    Bradycardia 11/26/2015   Coronary artery disease    coronary artery calcifications by chest CT with normal nuclear stress test 2013   GERD (gastroesophageal reflux disease)    Glaucoma    Hyperlipidemia    Hypertension      Assessment: 76 y/o M with new onset afib/RVR. Starting heparin. Hgb 11.9. Renal function good. PTA meds reviewed.  Initial heparin level therapeutic on  1200 units/hr  Goal of Therapy:  Heparin level 0.3-0.7 units/ml Monitor platelets by anticoagulation protocol: Yes   Plan:  Continue heparin gtt at 1200 units/hr Daily heparin level, CBC, s/s bleeding F/u long term [facility] plan  Bertis Ruddy, PharmD Clinical Pharmacist ED Pharmacist Phone # [phone] 12/28/2021 6:19 PM

## 2021-12-28 NOTE — H&P (Signed)
Cardiology Admission History and Physical:   Patient ID: Caleb Anderson MRN: [id number]; DOB: 1946-01-07   Admission date: 12/27/2021  Primary Care Provider: Mayra Neer, MD [place] Cardiologist: None  [facility] Electrophysiologist:  None   Chief Complaint:  irregular heart rate  Patient Profile:   Caleb Anderson is a 76 y.o. male with glaucoma, HTN, HLD, coronary artery calcifications, BPH, anemia, presenting for evaluation of chest discomfort and irregular heart rate.  History of Present Illness:   Caleb Anderson was in his normal state of heath this evening watching TV with his family. He walked to the bathroom, and noticed pressure in his neck and upper chest that felt like "indigestion". He felt his pulse, which felt irregular and "not steady". Had associated mild shortness of breath, weakness, and felt overall uncomfortable. Denies associate n/v/radiation. He asked his daughter to feel his pulse, who also stated if felt irregular. He reports never having prior similar symptoms. Given continued uncomfortable feeling in his neck along with irregular pulse, him and his family drove up the street to their local fire department to get checked out.  Upon arrival there, EMS telemetry revealed HR between 150-200 with narrow complex tachycardia and irregular RR interval c/f atrial fibrillation. Per their run strip "M4 reports his family had driven him here for an evaluation of symptoms of heartburn, palpitations, weakness, and shortness of breath. She initially noted AFIB RVR at a rate between 150-200bpm on her monitor, she reports there were also 2 short runs of VTACH, each about 4 beats long. She had began an IV and was setting up diltiazem, when the patient suddenly converted on his own into a normal sinus rhythm. On M262 arrival, the patient was in said sinus rhythm with all symptoms also having resolved."  On presentation to the ED, troponins frended from 78 -> 146. EKGs here  with NSR.      Past Medical History:  Diagnosis Date   Asbestosis ([place])    Bradycardia 11/26/2015   Coronary artery disease    coronary artery calcifications by chest CT with normal nuclear stress test 2013   GERD (gastroesophageal reflux disease)    Glaucoma    Hyperlipidemia    Hypertension     History reviewed. No pertinent surgical history.   Medications Prior to Admission: Prior to Admission medications   Medication Sig Start Date End Date Taking? Authorizing Provider  acetaminophen (TYLENOL) 500 MG tablet Take 500-1,000 mg by mouth every 6 (six) hours as needed for moderate pain.   Yes [provider]  aspirin 81 MG tablet Take 81 mg by mouth daily.   Yes [provider]  dorzolamide-timolol (COSOPT) 22.3-6.8 MG/ML ophthalmic solution Place 1 drop into both eyes at bedtime. 11/09/21  Yes [provider]  ibuprofen (ADVIL) 600 MG tablet Take 600 mg by mouth every 4 (four) hours as needed for moderate pain. 12/23/21  Yes [provider]  lisinopril-hydrochlorothiazide (ZESTORETIC) 20-12.5 MG tablet Take 1 tablet by mouth daily. 11/06/21  Yes [provider]  meloxicam (MOBIC) 15 MG tablet Take 15 mg by mouth daily.   Yes [provider]  Omega-3 Fatty Acids (FISH OIL) 1000 MG CAPS Take 2,000 mg by mouth daily.   Yes [provider]  pravastatin (PRAVACHOL) 10 MG tablet Take 10 mg by mouth daily. 12/12/21  Yes [provider]  tamsulosin (FLOMAX) 0.4 MG CAPS capsule Take 0.4 mg by mouth every morning. 10/16/21  Yes [provider]  acetaminophen-codeine (TYLENOL #3)  300-30 MG tablet Take 1 tablet by mouth every 4 (four) hours as needed for moderate pain or severe pain. 12/23/21   [provider]     Allergies:    Allergies  Allergen Reactions   Fish Oil     Weight gain    Statins Other (See Comments)    Joint pain and fatigue(Lipitor, crestor, simvastatin, and pravastatin)   Trilipix  [Choline Fenofibrate] Other (See Comments)    Joint pain/fatigue   Welchol [Colesevelam Hcl]     Aches    Zetia [Ezetimibe] Other (See Comments)    Joint pain/fatigue    Social History:   Social History   Socioeconomic History   Marital status: Married    Spouse name: Not on file   Number of children: Not on file   Years of education: Not on file   Highest education level: Not on file  Occupational History   Not on file  Tobacco Use   Smoking status: Former    Packs/day: 1.00    Years: 15.00    Total pack years: 15.00    Types: Cigarettes    Quit date: 03/22/1990    Years since quitting: 31.7   Smokeless tobacco: Never  Substance and Sexual Activity   Alcohol use: Yes    Alcohol/week: 0.0 standard drinks of alcohol    Comment: ocassionally   Drug use: No   Sexual activity: Not on file  Other Topics Concern   Not on file  Social History Narrative   Not on file   Social Determinants of Health   Financial Resource Strain: Not on file  Food Insecurity: Not on file  Transportation Needs: Not on file  Physical Activity: Not on file  Stress: Not on file  Social Connections: Not on file  Intimate Partner Violence: Not on file    Family History:  reviewed as below The patient's family history includes COPD in his mother and sister; Heart attack in his brother; Heart disease in his brother; Kidney failure in his father.    ROS:   Review of Systems: [y] = yes, '[ ]'$  = no      General: Weight gain '[ ]'$ ; Weight loss '[ ]'$ ; Anorexia '[ ]'$ ; Fatigue '[ ]'$ ; Fever '[ ]'$ ; Chills '[ ]'$ ; Weakness '[ ]'$    Cardiac: Chest pain/pressure [y]; Resting SOB [y]; Exertional SOB '[ ]'$ ; Orthopnea '[ ]'$ ; Pedal Edema '[ ]'$ ; Palpitations [y]; Syncope '[ ]'$ ; Presyncope '[ ]'$ ; Paroxysmal nocturnal dyspnea '[ ]'$    Pulmonary: Cough '[ ]'$ ; Wheezing '[ ]'$ ; Hemoptysis '[ ]'$ ; Sputum '[ ]'$ ; Snoring '[ ]'$    GI: Vomiting '[ ]'$ ; Dysphagia '[ ]'$ ; Melena '[ ]'$ ; Hematochezia '[ ]'$ ; Heartburn '[ ]'$ ; Abdominal pain '[ ]'$ ; Constipation '[ ]'$ ; Diarrhea '[ ]'$ ;  BRBPR '[ ]'$    GU: Hematuria '[ ]'$ ; Dysuria '[ ]'$ ; Nocturia '[ ]'$  Vascular: Pain in legs with walking '[ ]'$ ; Pain in feet with lying flat '[ ]'$ ; Non-healing sores '[ ]'$ ; Stroke '[ ]'$ ; TIA '[ ]'$ ; Slurred speech '[ ]'$ ;   Neuro: Headaches '[ ]'$ ; Vertigo '[ ]'$ ; Seizures '[ ]'$ ; Paresthesias '[ ]'$ ;Blurred vision '[ ]'$ ; Diplopia '[ ]'$ ; Vision changes '[ ]'$    Ortho/Skin: Arthritis '[ ]'$ ; Joint pain '[ ]'$ ; Muscle pain '[ ]'$ ; Joint swelling '[ ]'$ ; Back Pain '[ ]'$ ; Rash '[ ]'$    Psych: Depression '[ ]'$ ; Anxiety '[ ]'$    Heme: Bleeding problems '[ ]'$ ; Clotting disorders '[ ]'$ ; Anemia '[ ]'$    Endocrine: Diabetes '[ ]'$ ; Thyroid dysfunction '[ ]'$   Physical Exam/Data:   Vitals:   12/28/21 0230 12/28/21 0300 12/28/21 0330 12/28/21 0400  BP: 127/68 124/67 (!) 191/77 134/86  Pulse: 60 (!) 55 62 (!) 57  Resp: '16 14 16 15  '$ Temp:      SpO2: 96% 97% 99% 98%   No intake or output data in the 24 hours ending 12/28/21 0418    07/22/2016   10:08 AM 12/18/2015    8:47 AM 11/26/2015    9:01 AM  Last 3 Weights  Weight (lbs) 190 lb 197 lb 12 oz 203 lb 6.4 oz  Weight (kg) 86.183 kg 89.699 kg 92.262 kg     There is no height or weight on file to calculate BMI.  General:  Well nourished, well developed, in no acute distress HEENT: normal Lymph: no adenopathy Neck: no JVD Endocrine:  No thryomegaly Vascular: No carotid bruits; FA pulses 2+ bilaterally without bruits  Cardiac:  normal S1, S2; RRR; no murmur Lungs:  clear to auscultation bilaterally, no wheezing, rhonchi or rales  Abd: soft, nontender, no hepatomegaly  Ext: no edema Musculoskeletal:  No deformities, BUE and BLE strength normal and equal Skin: warm and dry  Neuro:  CNs 2-12 intact, no focal abnormalities noted Psych:  Normal affect   EKG:  The ECG that was done 10/9 was personally reviewed and demonstrates NSR with LPFB  EMS Run Strips: personally reviewed reveal atrial fibrillation with rapid ventricular response   Relevant CV Studies: CT scan reveals left main and 3v coronary artery  calcifications  2013 nuclear stress test: negative  Laboratory Data:  High Sensitivity Troponin:   Recent Labs  Lab 12/27/21 2340 12/28/21 0138  TROPONINIHS 78* 146*      Chemistry Recent Labs  Lab 12/27/21 2340  NA 142  K 2.9*  CL 117*  CO2 20*  GLUCOSE 92  BUN 15  CREATININE 0.82  CALCIUM 6.8*  GFRNONAA >60  ANIONGAP 5    Recent Labs  Lab 12/27/21 2340  PROT 4.7*  ALBUMIN 2.6*  AST 19  ALT 18  ALKPHOS 49  BILITOT 0.7   Hematology Recent Labs  Lab 12/27/21 2340  WBC 6.3  RBC 4.20*  HGB 11.9*  HCT 36.4*  MCV 86.7  MCH 28.3  MCHC 32.7  RDW 13.0  PLT 194   BNPNo results for input(s): "BNP", "PROBNP" in the last 168 hours.  DDimer No results for input(s): "DDIMER" in the last 168 hours.  Radiology/Studies:  DG Chest Port 1 View  Result Date: 12/27/2021 CLINICAL DATA:  chest pain EXAM: PORTABLE CHEST 1 VIEW COMPARISON:  Chest x-ray 06/24/2008, CT chest 10/24/2015 FINDINGS: The heart and mediastinal contours are unchanged. Aortic calcification. No focal consolidation. No pulmonary edema. No pleural effusion. No pneumothorax. No acute osseous abnormality. IMPRESSION: 1. No active disease. 2.  Aortic Atherosclerosis (ICD10-I70.0). Electronically Signed   By: Iven Finn M.D.   On: 12/27/2021 23:48      TIMI Risk Score for Unstable Angina or Non-ST Elevation MI:   The patient's TIMI risk score is 4, which indicates a 20% risk of all cause mortality, new or recurrent myocardial infarction or need for urgent revascularization in the next 14 days.    Assessment and Plan:   Atrial Fibrillation with RVR Type II NSTEMI 3v Coronary Artery Calcifications Presenting with afib with RVR between the 150s-200s with EMS (strips above), which constitutes a new diagnosis of atrial fibrillation from Mr Bruna. During this episode, he developed chest/neck tightness, SOB, and an uncomfortable sensation,  which combined with troponins doubling from 78 -> 146, is c/f Type  II NSTEMI with demand ischemia from tachycardia. He has known L main calcifications and 3v coronary artery calcifications on CT imaging. His CHADS2VASC2 score will be 4 in < one month (turns 76 in November), with 4.8% annual risk of stroke. Based on this, should be on long term anticoagulation for afib. For his NSTEMI, would benefit from left heart cath at this point to further define his coronary anatomy given chest pain with this episode. -NPO -Left Heart Cath in am -Echo -Lipid Panel, Lp (a), TSH (for new afib), a1c -Heparin gtt for now -On discharge, start apixaban -Will need starting of rate control agent for afib s/p cath, likely metoprolol succinate '25mg'$  BID -Continue ASA '81mg'$  -Continue pravastatin (intollerant to other statins and zetia) -Given statin intolerance and CAD, should price check [id number] -Telemetry  BPH -Continue home tamsulosin  Normocytic Anemia -Iron Panel  Arthritis -Hold home mobic  Glaucoma -Continue home drops  HLD -Continue home pravastatin  Aortic Atherosclerosis -Continue home pravastatin, ASA  Hypokalemia HTN Hypokalemia likely 2/2 HCTZ.  -s/p K40, redose 67mq -continue lisinopril/hctz -may need long term potassium  Severity of Illness: The appropriate patient status for this patient is INPATIENT. Inpatient status is judged to be reasonable and necessary in order to provide the required intensity of service to ensure the patient's safety. The patient's presenting symptoms, physical exam findings, and initial radiographic and laboratory data in the context of their chronic comorbidities is felt to place them at high risk for further clinical deterioration. Furthermore, it is not anticipated that the patient will be medically stable for discharge from the hospital within 2 midnights of admission.   * I certify that at the point of admission it is my clinical judgment that the patient will require inpatient hospital care spanning beyond 2 midnights  from the point of admission due to high intensity of service, high risk for further deterioration and high frequency of surveillance required.*   For questions or updates, please contact CConventPlease consult [email or web address] for contact info under     Signed, ABronson Curb MD  12/28/2021 4:18 AM

## 2021-12-28 NOTE — Progress Notes (Signed)
Patient seen and examined. History reviewed.  Agree with assessment of Dr Tereasa Coop this am. Patient presented last night with chest pain radiating to the neck. Found to be in Afib with RVR. Converted spontaneously and symptoms improved. Patient complains of arthritic pain this am (hasn't taken his meloxicam).   He has history of severe coronary calcification. Negative Myoview in 2017. Severe HLD intolerant to multiple meds. History of HTN.  Ecg shows sinus brady now. LAD. Troponin elevated to 146.   Patient has NSTEMI. While this may be due to demand ischemia in setting of AFib his risk for CAD is high. I would recommend cardiac cath to evaluate. Potassium repleted and will recheck level. Echo is pending. Discussed cardiac cath procedure and risks with patient and he  is agreeable to proceed. Will ultimately need anticoagulation pending results of cath. Will DC HCTZ given hypokalemia.   The procedure and risks were reviewed including but not limited to death, myocardial infarction, stroke, arrythmias, bleeding, transfusion, emergency surgery, dye allergy, or renal dysfunction. The patient voices understanding and is agreeable to proceed.  Marvella Jenning [place] MD, [facility] 12/28/2021  9:00 AM

## 2021-12-28 NOTE — ED Notes (Signed)
Patient transported to Vascular (for stress test)

## 2021-12-28 NOTE — ED Notes (Signed)
Cardiology at bedside.

## 2021-12-29 ENCOUNTER — Encounter (HOSPITAL_COMMUNITY)
Admission: EM | Disposition: A | Discharge status: Home / Self Care | Payer: Self-pay | Source: Home / Self Care | Attending: Cardiology

## 2021-12-29 ENCOUNTER — Encounter (HOSPITAL_COMMUNITY): Payer: Self-pay | Admitting: Cardiology

## 2021-12-29 ENCOUNTER — Telehealth (HOSPITAL_COMMUNITY): Payer: Self-pay | Admitting: Pharmacy Technician

## 2021-12-29 ENCOUNTER — Other Ambulatory Visit (HOSPITAL_COMMUNITY): Payer: Self-pay

## 2021-12-29 DIAGNOSIS — I2510 Atherosclerotic heart disease of native coronary artery without angina pectoris: Secondary | ICD-10-CM | POA: Diagnosis not present

## 2021-12-29 DIAGNOSIS — I10 Essential (primary) hypertension: Secondary | ICD-10-CM | POA: Diagnosis not present

## 2021-12-29 DIAGNOSIS — I480 Paroxysmal atrial fibrillation: Secondary | ICD-10-CM | POA: Diagnosis not present

## 2021-12-29 DIAGNOSIS — I214 Non-ST elevation (NSTEMI) myocardial infarction: Principal | ICD-10-CM

## 2021-12-29 HISTORY — PX: CORONARY STENT INTERVENTION: CATH118234

## 2021-12-29 HISTORY — PX: CORONARY LITHOTRIPSY: CATH118330

## 2021-12-29 HISTORY — PX: LEFT HEART CATH AND CORONARY ANGIOGRAPHY: CATH118249

## 2021-12-29 HISTORY — PX: CORONARY PRESSURE/FFR STUDY: CATH118243

## 2021-12-29 HISTORY — PX: INTRAVASCULAR PRESSURE WIRE/FFR STUDY: CATH118243

## 2021-12-29 LAB — BASIC METABOLIC PANEL
Anion gap: 7 (ref 5–15)
BUN: 20 mg/dL (ref 8–23)
CO2: 21 mmol/L — ABNORMAL LOW (ref 22–32)
Calcium: 8.1 mg/dL — ABNORMAL LOW (ref 8.9–10.3)
Chloride: 108 mmol/L (ref 98–111)
Creatinine, Ser: 0.96 mg/dL (ref 0.61–1.24)
GFR, Estimated: >60 mL/min (ref >60)
Glucose, Bld: 115 mg/dL — ABNORMAL HIGH (ref 70–99)
Potassium: 3.9 mmol/L (ref 3.5–5.1)
Sodium: 136 mmol/L (ref 135–145)

## 2021-12-29 LAB — LIPOPROTEIN A (LPA): Lipoprotein (a): 18.6 nmol/L (ref <75.0)

## 2021-12-29 LAB — CBC
HCT: 41 % (ref 39.0–52.0)
Hemoglobin: 13.4 g/dL (ref 13.0–17.0)
MCH: 27.9 pg (ref 26.0–34.0)
MCHC: 32.7 g/dL (ref 30.0–36.0)
MCV: 85.2 fL (ref 80.0–100.0)
Platelets: 224 10*3/uL (ref 150–400)
RBC: 4.81 MIL/uL (ref 4.22–5.81)
RDW: 13.2 % (ref 11.5–15.5)
WBC: 8.4 10*3/uL (ref 4.0–10.5)
nRBC: 0 % (ref 0.0–0.2)

## 2021-12-29 LAB — HEPARIN LEVEL (UNFRACTIONATED): Heparin Unfractionated: 0.42 IU/mL (ref 0.30–0.70)

## 2021-12-29 SURGERY — LEFT HEART CATH AND CORONARY ANGIOGRAPHY
Anesthesia: LOCAL

## 2021-12-29 MED ORDER — LIDOCAINE HCL (PF) 1 % IJ SOLN
INTRAMUSCULAR | Status: AC
  Filled 2021-12-29: qty 30

## 2021-12-29 MED ORDER — HEPARIN (PORCINE) IN NACL 1000-0.9 UT/500ML-% IV SOLN
INTRAVENOUS | Status: AC
  Filled 2021-12-29: qty 1000

## 2021-12-29 MED ORDER — MIDAZOLAM HCL 2 MG/2ML IJ SOLN
INTRAMUSCULAR | Status: AC
  Filled 2021-12-29: qty 2

## 2021-12-29 MED ORDER — SODIUM CHLORIDE 0.9 % IV SOLN: 250.0000 mL | INTRAVENOUS | Status: DC | PRN

## 2021-12-29 MED ORDER — VERAPAMIL HCL 2.5 MG/ML IV SOLN
INTRAVENOUS | Status: DC | PRN
  Administered 2021-12-29: 10 mL via INTRA_ARTERIAL

## 2021-12-29 MED ORDER — IOHEXOL 350 MG/ML SOLN
INTRAVENOUS | Status: DC | PRN
  Administered 2021-12-29: 140 mL

## 2021-12-29 MED ORDER — NITROGLYCERIN 1 MG/10 ML FOR IR/CATH LAB
INTRA_ARTERIAL | Status: AC
  Filled 2021-12-29: qty 10

## 2021-12-29 MED ORDER — APIXABAN 5 MG PO TABS
5.0000 mg | ORAL_TABLET | Freq: Two times a day (BID) | ORAL | Status: DC
  Administered 2021-12-29 – 2021-12-30 (×2): 5 mg via ORAL
  Filled 2021-12-29 (×2): qty 1

## 2021-12-29 MED ORDER — SODIUM CHLORIDE 0.9% FLUSH: 3.0000 mL | INTRAVENOUS | Status: DC | PRN

## 2021-12-29 MED ORDER — SODIUM CHLORIDE 0.9% FLUSH
3.0000 mL | Freq: Two times a day (BID) | INTRAVENOUS | Status: DC
  Administered 2021-12-29 – 2021-12-30 (×2): 3 mL via INTRAVENOUS

## 2021-12-29 MED ORDER — SODIUM CHLORIDE 0.9 % WEIGHT BASED INFUSION: 1.0000 mL/kg/h | INTRAVENOUS | Status: AC

## 2021-12-29 MED ORDER — CLOPIDOGREL BISULFATE 300 MG PO TABS
ORAL_TABLET | ORAL | Status: DC | PRN
  Administered 2021-12-29: 600 mg via ORAL

## 2021-12-29 MED ORDER — LIDOCAINE HCL (PF) 1 % IJ SOLN
INTRAMUSCULAR | Status: DC | PRN
  Administered 2021-12-29: 2 mL

## 2021-12-29 MED ORDER — HEPARIN SODIUM (PORCINE) 1000 UNIT/ML IJ SOLN
INTRAMUSCULAR | Status: DC | PRN
  Administered 2021-12-29 (×2): 4000 [IU] via INTRAVENOUS

## 2021-12-29 MED ORDER — VERAPAMIL HCL 2.5 MG/ML IV SOLN
INTRAVENOUS | Status: AC
  Filled 2021-12-29: qty 2

## 2021-12-29 MED ORDER — FENTANYL CITRATE (PF) 100 MCG/2ML IJ SOLN
INTRAMUSCULAR | Status: AC
  Filled 2021-12-29: qty 2

## 2021-12-29 MED ORDER — HEPARIN SODIUM (PORCINE) 1000 UNIT/ML IJ SOLN
INTRAMUSCULAR | Status: AC
  Filled 2021-12-29: qty 10

## 2021-12-29 MED ORDER — FENTANYL CITRATE (PF) 100 MCG/2ML IJ SOLN
INTRAMUSCULAR | Status: DC | PRN
  Administered 2021-12-29 (×2): 25 ug via INTRAVENOUS

## 2021-12-29 MED ORDER — NITROGLYCERIN 1 MG/10 ML FOR IR/CATH LAB
INTRA_ARTERIAL | Status: DC | PRN
  Administered 2021-12-29 (×3): 200 ug via INTRACORONARY

## 2021-12-29 MED ORDER — CLOPIDOGREL BISULFATE 75 MG PO TABS
75.0000 mg | ORAL_TABLET | Freq: Every day | ORAL | Status: DC
  Administered 2021-12-30: 75 mg via ORAL
  Filled 2021-12-29: qty 1

## 2021-12-29 MED ORDER — CLOPIDOGREL BISULFATE 300 MG PO TABS
ORAL_TABLET | ORAL | Status: AC
  Filled 2021-12-29: qty 2

## 2021-12-29 MED ORDER — MIDAZOLAM HCL 2 MG/2ML IJ SOLN
INTRAMUSCULAR | Status: DC | PRN
  Administered 2021-12-29 (×2): 1 mg via INTRAVENOUS

## 2021-12-29 MED ORDER — HEPARIN (PORCINE) IN NACL 1000-0.9 UT/500ML-% IV SOLN
INTRAVENOUS | Status: DC | PRN
  Administered 2021-12-29 (×2): 500 mL

## 2021-12-29 SURGICAL SUPPLY — 25 items
BALL SAPPHIRE NC24 3.0X15 (BALLOONS) ×1
BALLN SAPPHIRE 2.0X12 (BALLOONS) ×1
BALLN SAPPHIRE 2.5X12 (BALLOONS) ×1
BALLOON SAPPHIRE 2.0X12 (BALLOONS) IMPLANT
BALLOON SAPPHIRE 2.5X12 (BALLOONS) IMPLANT
BALLOON SAPPHIRE NC24 3.0X15 (BALLOONS) IMPLANT
BAND ZEPHYR COMPRESS 30 LONG (HEMOSTASIS) IMPLANT
CATH 5FR JL3.5 JR4 ANG PIG MP (CATHETERS) IMPLANT
CATH SHOCKWAVE 3.0X12 (CATHETERS) IMPLANT
CATH VISTA GUIDE 6FR XBLAD3.5 (CATHETERS) IMPLANT
CATHETER SHOCKWAVE 3.0X12 (CATHETERS) ×1
DEVICE RAD COMP TR BAND LRG (VASCULAR PRODUCTS) IMPLANT
GLIDESHEATH SLEND SS 6F .021 (SHEATH) IMPLANT
GUIDEWIRE INQWIRE 1.5J.035X260 (WIRE) IMPLANT
GUIDEWIRE PRESSURE X 175 (WIRE) IMPLANT
INQWIRE 1.5J .035X260CM (WIRE) ×1
KIT ENCORE 26 ADVANTAGE (KITS) IMPLANT
KIT HEART LEFT (KITS) ×1 IMPLANT
PACK CARDIAC CATHETERIZATION (CUSTOM PROCEDURE TRAY) ×1 IMPLANT
STENT SYNERGY XD 3.0X24 (Permanent Stent) IMPLANT
SYNERGY XD 3.0X24 (Permanent Stent) ×1 IMPLANT
TRANSDUCER W/STOPCOCK (MISCELLANEOUS) ×1 IMPLANT
TUBING CIL FLEX 10 FLL-RA (TUBING) ×1 IMPLANT
WIRE ASAHI PROWATER 180CM (WIRE) IMPLANT
WIRE RUNTHROUGH IZANAI 014 180 (WIRE) IMPLANT

## 2021-12-29 NOTE — Telephone Encounter (Signed)
Pharmacy Patient Advocate Encounter  Insurance verification completed.    The patient is insured through [employer] Part D   The patient is currently admitted and ran test claims for the following: Eliquis .  Copays and coinsurance results were relayed to Inpatient clinical team.      

## 2021-12-29 NOTE — Progress Notes (Signed)
ANTICOAGULATION CONSULT NOTE   Pharmacy Consult for Heparin  Indication: atrial fibrillation  Allergies  Allergen Reactions   Fish Oil     Weight gain    Statins Other (See Comments)    Joint pain and fatigue(Lipitor, crestor, simvastatin, and pravastatin)   Trilipix [Choline Fenofibrate] Other (See Comments)    Joint pain/fatigue   Welchol [Colesevelam Hcl]     Aches    Zetia [Ezetimibe] Other (See Comments)    Joint pain/fatigue     Vital Signs: Temp: 98 F (36.7 C) (10/10 0504) Temp Source: Oral (10/10 0504) BP: 127/58 (10/10 0504) Pulse Rate: 52 (10/10 0504)  Labs: Recent Labs    12/27/21 2340 12/28/21 0138 12/28/21 1500 12/28/21 1612 12/29/21 0457  HGB 11.9*  --   --   --  13.4  HCT 36.4*  --   --   --  41.0  PLT 194  --   --   --  224  HEPARINUNFRC  --   --  0.37  --  0.42  CREATININE 0.82  --   --  0.99 0.96  TROPONINIHS 78* 146*  --   --   --      Estimated Creatinine Clearance: 66.1 mL/min (by C-G formula based on SCr of 0.96 mg/dL).   Medical History: Past Medical History:  Diagnosis Date   Asbestosis ([place])    Bradycardia 11/26/2015   Coronary artery disease    coronary artery calcifications by chest CT with normal nuclear stress test 2013   GERD (gastroesophageal reflux disease)    Glaucoma    Hyperlipidemia    Hypertension      Assessment: 76 y/o M with new onset afib/RVR. Starting heparin. Hgb 11.9. Renal function good. PTA meds reviewed.  Heparin level therapeutic (0.42) while on heparin 1200 unit/hr.   Goal of Therapy:  Heparin level 0.3-0.7 units/ml Monitor platelets by anticoagulation protocol: Yes   Plan:  Continue heparin gtt at 1200 units/hr Daily heparin level, CBC, s/s bleeding F/u long term [facility] plan  Esmeralda Arthur, PharmD  Clinical Pharmacist ED Pharmacist Phone # [phone] 12/29/2021 7:52 AM

## 2021-12-29 NOTE — H&P (View-Only) (Signed)
Rounding Note    Patient Name: Caleb Anderson Date of Encounter: 12/29/2021  [place] Cardiologist: None New  Subjective   Feels well this am. No chest pain, SOB or palpitations.   Inpatient Medications    Scheduled Meds:  aspirin EC  81 mg Oral Daily   dorzolamide-timolol  1 drop Both Eyes QHS   lisinopril  20 mg Oral Daily   pravastatin  10 mg Oral Daily   sodium chloride flush  3 mL Intravenous Q12H   tamsulosin  0.4 mg Oral q morning   Continuous Infusions:  sodium chloride 50 mL/hr at 12/29/21 0709   heparin 1,200 Units/hr (12/28/21 0716)   PRN Meds: acetaminophen, nitroGLYCERIN, ondansetron (ZOFRAN) IV   Vital Signs    Vitals:   12/29/21 0100 12/29/21 0425 12/29/21 0504 12/29/21 0506  BP: (!) 111/52 (!) 114/54 (!) 127/58   Pulse: (!) 54 (!) 49 (!) 52   Resp: '14 13 17   '$ Temp:   98 F (36.7 C)   TempSrc:   Oral   SpO2: 96% 96% 98%   Weight:    80.7 kg  Height:    '5\' 5"'$  (1.651 m)    Intake/Output Summary (Last 24 hours) at 12/29/2021 0835 Last data filed at 12/29/2021 0709 Gross per 24 hour  Intake 499.07 ml  Output --  Net 499.07 ml      12/29/2021    5:06 AM 07/22/2016   10:08 AM 12/18/2015    8:47 AM  Last 3 Weights  Weight (lbs) 178 lb 190 lb 197 lb 12 oz  Weight (kg) 80.74 kg 86.183 kg 89.699 kg      Telemetry    Sinus brady - Personally Reviewed  ECG    Sinus brady. LAD. No acute changes - Personally Reviewed  Physical Exam   GEN: No acute distress.   Neck: No JVD Cardiac: RRR, no murmurs, rubs, or gallops.  Respiratory: Clear to auscultation bilaterally. GI: Soft, nontender, non-distended  MS: No edema; No deformity. Neuro:  Nonfocal  Psych: Normal affect   Labs    High Sensitivity Troponin:   Recent Labs  Lab 12/27/21 2340 12/28/21 0138  TROPONINIHS 78* 146*     Chemistry Recent Labs  Lab 12/27/21 2340 12/28/21 1612 12/29/21 0457  NA 142 141 136  K 2.9* 4.4 3.9  CL 117* 107 108  CO2 20* 24  21*  GLUCOSE 92 102* 115*  BUN '15 16 20  '$ CREATININE 0.82 0.99 0.96  CALCIUM 6.8* 8.7* 8.1*  MG 1.7  --   --   PROT 4.7*  --   --   ALBUMIN 2.6*  --   --   AST 19  --   --   ALT 18  --   --   ALKPHOS 49  --   --   BILITOT 0.7  --   --   GFRNONAA >60 >60 >60  ANIONGAP '5 10 7    '$ Lipids  Recent Labs  Lab 12/28/21 0700  CHOL 173  TRIG 133  HDL 33*  LDLCALC 113*  CHOLHDL 5.2    Hematology Recent Labs  Lab 12/27/21 2340 12/29/21 0457  WBC 6.3 8.4  RBC 4.20* 4.81  HGB 11.9* 13.4  HCT 36.4* 41.0  MCV 86.7 85.2  MCH 28.3 27.9  MCHC 32.7 32.7  RDW 13.0 13.2  PLT 194 224   Thyroid  Recent Labs  Lab 12/28/21 0700  TSH 2.390    BNP Recent Labs  Lab 12/28/21 0700  BNP 92.2    DDimer No results for input(s): "DDIMER" in the last 168 hours.   Radiology    ECHOCARDIOGRAM COMPLETE  Result Date: 12/28/2021    ECHOCARDIOGRAM REPORT   Patient Name:   Caleb Anderson Date of Exam: 12/28/2021 Medical Rec #:  [id number]        Height:       64.0 in Accession #:    [id number]       Weight:       190.0 lb Date of Birth:  December 17, 1945        BSA:          1.914 m Patient Age:    76 years         BP:           129/58 mmHg Patient Gender: M                HR:           49 bpm. Exam Location:  Inpatient Procedure: 2D Echo, 3D Echo, Cardiac Doppler, Color Doppler and Intracardiac            Opacification Agent Indications:    Atrial Fibrillation I48.91  History:        Patient has no prior history of Echocardiogram examinations.                 NSTEMI and CAD, Arrythmias:Atrial Fibrillation and Bradycardia,                 Signs/Symptoms:Chest Pain; Risk Factors:Former Smoker,                 Dyslipidemia and Hypertension.  Sonographer:    Greer Pickerel Referring Phys: [id number] Bronson Curb  Sonographer Comments: Technically difficult study due to poor echo windows, suboptimal parasternal window and suboptimal apical window. Image acquisition challenging due to patient body habitus and Image  acquisition challenging due to respiratory motion. IMPRESSIONS  1. Left ventricular ejection fraction, by estimation, is 60 to 65%. Left ventricular ejection fraction by 3D volume is 60 %. The left ventricle has normal function. The left ventricle has no regional wall motion abnormalities. Left ventricular diastolic  parameters are indeterminate.  2. Right ventricular systolic function is normal. The right ventricular size is normal.  3. Left atrial size was moderately dilated.  4. The mitral valve is normal in structure. No evidence of mitral valve regurgitation. No evidence of mitral stenosis.  5. The aortic valve is normal in structure. Aortic valve regurgitation is not visualized. No aortic stenosis is present.  6. Aortic dilatation noted. There is mild dilatation of the aortic root, measuring 41 mm.  7. The inferior vena cava is normal in size with greater than 50% respiratory variability, suggesting right atrial pressure of 3 mmHg. FINDINGS  Left Ventricle: Left ventricular ejection fraction, by estimation, is 60 to 65%. Left ventricular ejection fraction by 3D volume is 60 %. The left ventricle has normal function. The left ventricle has no regional wall motion abnormalities. Definity contrast agent was given IV to delineate the left ventricular endocardial borders. The left ventricular internal cavity size was normal in size. There is no left ventricular hypertrophy. Left ventricular diastolic parameters are indeterminate. Right Ventricle: The right ventricular size is normal. No increase in right ventricular wall thickness. Right ventricular systolic function is normal. Left Atrium: Left atrial size was moderately dilated. Right Atrium: Right atrial size was normal in size. Pericardium: There is no  evidence of pericardial effusion. Mitral Valve: The mitral valve is normal in structure. No evidence of mitral valve regurgitation. No evidence of mitral valve stenosis. Tricuspid Valve: The tricuspid valve is  normal in structure. Tricuspid valve regurgitation is not demonstrated. No evidence of tricuspid stenosis. Aortic Valve: The aortic valve is normal in structure. Aortic valve regurgitation is not visualized. No aortic stenosis is present. Pulmonic Valve: The pulmonic valve was normal in structure. Pulmonic valve regurgitation is not visualized. No evidence of pulmonic stenosis. Aorta: Aortic dilatation noted. There is mild dilatation of the aortic root, measuring 41 mm. Venous: The inferior vena cava is normal in size with greater than 50% respiratory variability, suggesting right atrial pressure of 3 mmHg. IAS/Shunts: No atrial level shunt detected by color flow Doppler.  LEFT VENTRICLE PLAX 2D LVIDd:         3.90 cm         Diastology LVIDs:         2.90 cm         LV e' medial:    6.57 cm/s LV PW:         0.80 cm         LV E/e' medial:  13.9 LV IVS:        0.90 cm         LV e' lateral:   10.10 cm/s                                LV E/e' lateral: 9.0                                 3D Volume EF                                LV 3D EF:    Left                                             ventricul                                             ar                                             ejection                                             fraction                                             by 3D  volume is                                             60 %.                                 3D Volume EF:                                3D EF:        60 %                                LV EDV:       127 ml                                LV ESV:       51 ml                                LV SV:        75 ml RIGHT VENTRICLE RV S prime:     14.00 cm/s TAPSE (M-mode): 2.4 cm LEFT ATRIUM             Index        RIGHT ATRIUM           Index LA diam:        3.30 cm 1.72 cm/m   RA Area:     16.70 cm LA Vol (A2C):   91.7 ml 47.91 ml/m  RA Volume:   43.70 ml  22.83 ml/m LA Vol  (A4C):   47.3 ml 24.71 ml/m LA Biplane Vol: 68.9 ml 35.99 ml/m  AORTIC VALVE LVOT Vmax:   102.00 cm/s LVOT Vmean:  60.800 cm/s LVOT VTI:    0.223 m  AORTA Ao Root diam: 3.95 cm Ao Asc diam:  3.20 cm MITRAL VALVE MV Area (PHT): 3.77 cm    SHUNTS MV Decel Time: 201 msec    Systemic VTI: 0.22 m MV E velocity: 91.30 cm/s MV A velocity: 65.60 cm/s MV E/A ratio:  1.39 Candee Furbish MD Electronically signed by Candee Furbish MD Signature Date/Time: 12/28/2021/2:31:01 PM    Final    DG Chest Port 1 View  Result Date: 12/27/2021 CLINICAL DATA:  chest pain EXAM: PORTABLE CHEST 1 VIEW COMPARISON:  Chest x-ray 06/24/2008, CT chest 10/24/2015 FINDINGS: The heart and mediastinal contours are unchanged. Aortic calcification. No focal consolidation. No pulmonary edema. No pleural effusion. No pneumothorax. No acute osseous abnormality. IMPRESSION: 1. No active disease. 2.  Aortic Atherosclerosis (ICD10-I70.0). Electronically Signed   By: Iven Finn M.D.   On: 12/27/2021 23:48    Cardiac Studies   Echo 12/28/21: IMPRESSIONS     1. Left ventricular ejection fraction, by estimation, is 60 to 65%. Left  ventricular ejection fraction by 3D volume is 60 %. The left ventricle has  normal function. The left ventricle has no regional wall motion  abnormalities. Left ventricular diastolic   parameters are indeterminate.   2. Right ventricular systolic function is normal. The right ventricular  size is normal.   3. Left atrial size was moderately dilated.   4. The mitral valve is normal in structure. No evidence of mitral valve  regurgitation. No evidence of mitral stenosis.   5. The aortic valve is normal in structure. Aortic valve regurgitation is  not visualized. No aortic stenosis is present.   6. Aortic dilatation noted. There is mild dilatation of the aortic root,  measuring 41 mm.   7. The inferior vena cava is normal in size with greater than 50%  respiratory variability, suggesting right atrial pressure of  3 mmHg.   Patient Profile     76 y.o. male with glaucoma, HTN, HLD, coronary artery calcifications, BPH, anemia, presenting for evaluation of chest discomfort and irregular heart rate.  Assessment & Plan    Atrial Fibrillation with RVR- patient converted to NSR. Moderate LAE on Echo with normal LV function. Not a candidate for rate slowing medication with resting bradycardia. Plan to initiate oral anticoagulation once ischemic evaluation complete with [place] vasc score of 4.   NSTEMI- likely type 2 in setting of AFib but patient has high risk of CAD with heavy coronary calcification and risk factors. Had chest pain. Plan cardiac cath today.  CAD with known 3v Coronary Artery Calcifications- plan cardiac cath today -On discharge, start apixaban -no beta blocker given bradycardia  -Continue ASA '81mg'$  -Continue pravastatin (intollerant to other statins and zetia) -Given statin intolerance and CAD, would consider outpatient referral to lipid clinic. Goal LDL <70 -Telemetry   BPH -Continue home tamsulosin   Normocytic Anemia -Iron studies normal.    Arthritis -Hold home mobic   Glaucoma -Continue home drops   HLD -Continue home pravastatin   Aortic Atherosclerosis -Continue home pravastatin, ASA   Hypokalemia  HTN Hypokalemia likely 2/2 HCTZ.  -potassium repleted -continue lisinopril but hold HCTZ for now.  - BP currently well controlled.     For questions or updates, please contact [place] Please consult [email or web address] for contact info under        Signed, Dane Bloch [place], MD  12/29/2021, 8:35 AM

## 2021-12-29 NOTE — TOC Benefit Eligibility Note (Signed)
Patient Caleb Anderson, English as a foreign language completed.    The patient is currently admitted and upon discharge could be taking Eliquis 5 mg.  The current 30 day co-pay is $47.00.   The patient is insured through [employer] [place], [place] Patient Advocate Specialist [place] Patient Advocate Team Direct Number: [phone]  Fax: [phone]

## 2021-12-29 NOTE — Interval H&P Note (Signed)
History and Physical Interval Note:  12/29/2021 11:17 AM  Caleb Anderson  has presented today for surgery, with the diagnosis of nstemi.  The various methods of treatment have been discussed with the patient and family. After consideration of risks, benefits and other options for treatment, the patient has consented to  Procedure(s): LEFT HEART CATH AND CORONARY ANGIOGRAPHY (N/A) as a surgical intervention.  The patient's history has been reviewed, patient examined, no change in status, stable for surgery.  I have reviewed the patient's chart and labs.  Questions were answered to the patient's satisfaction.    Cath Lab Visit (complete for each Cath Lab visit)  Clinical Evaluation Leading to the Procedure:   ACS: Yes.    Non-ACS:    Anginal Classification: CCS IV  Anti-ischemic medical therapy: No Therapy  Non-Invasive Test Results: No non-invasive testing performed  Prior CABG: No previous CABG       Collier [facility] 12/29/2021 11:17 AM

## 2021-12-29 NOTE — Progress Notes (Signed)
Rounding Note    Patient Name: Caleb Anderson Date of Encounter: 12/29/2021  [place] Cardiologist: None New  Subjective   Feels well this am. No chest pain, SOB or palpitations.   Inpatient Medications    Scheduled Meds:  aspirin EC  81 mg Oral Daily   dorzolamide-timolol  1 drop Both Eyes QHS   lisinopril  20 mg Oral Daily   pravastatin  10 mg Oral Daily   sodium chloride flush  3 mL Intravenous Q12H   tamsulosin  0.4 mg Oral q morning   Continuous Infusions:  sodium chloride 50 mL/hr at 12/29/21 0709   heparin 1,200 Units/hr (12/28/21 0716)   PRN Meds: acetaminophen, nitroGLYCERIN, ondansetron (ZOFRAN) IV   Vital Signs    Vitals:   12/29/21 0100 12/29/21 0425 12/29/21 0504 12/29/21 0506  BP: (!) 111/52 (!) 114/54 (!) 127/58   Pulse: (!) 54 (!) 49 (!) 52   Resp: '14 13 17   '$ Temp:   98 F (36.7 C)   TempSrc:   Oral   SpO2: 96% 96% 98%   Weight:    80.7 kg  Height:    '5\' 5"'$  (1.651 m)    Intake/Output Summary (Last 24 hours) at 12/29/2021 0835 Last data filed at 12/29/2021 0709 Gross per 24 hour  Intake 499.07 ml  Output --  Net 499.07 ml      12/29/2021    5:06 AM 07/22/2016   10:08 AM 12/18/2015    8:47 AM  Last 3 Weights  Weight (lbs) 178 lb 190 lb 197 lb 12 oz  Weight (kg) 80.74 kg 86.183 kg 89.699 kg      Telemetry    Sinus brady - Personally Reviewed  ECG    Sinus brady. LAD. No acute changes - Personally Reviewed  Physical Exam   GEN: No acute distress.   Neck: No JVD Cardiac: RRR, no murmurs, rubs, or gallops.  Respiratory: Clear to auscultation bilaterally. GI: Soft, nontender, non-distended  MS: No edema; No deformity. Neuro:  Nonfocal  Psych: Normal affect   Labs    High Sensitivity Troponin:   Recent Labs  Lab 12/27/21 2340 12/28/21 0138  TROPONINIHS 78* 146*     Chemistry Recent Labs  Lab 12/27/21 2340 12/28/21 1612 12/29/21 0457  NA 142 141 136  K 2.9* 4.4 3.9  CL 117* 107 108  CO2 20* 24  21*  GLUCOSE 92 102* 115*  BUN '15 16 20  '$ CREATININE 0.82 0.99 0.96  CALCIUM 6.8* 8.7* 8.1*  MG 1.7  --   --   PROT 4.7*  --   --   ALBUMIN 2.6*  --   --   AST 19  --   --   ALT 18  --   --   ALKPHOS 49  --   --   BILITOT 0.7  --   --   GFRNONAA >60 >60 >60  ANIONGAP '5 10 7    '$ Lipids  Recent Labs  Lab 12/28/21 0700  CHOL 173  TRIG 133  HDL 33*  LDLCALC 113*  CHOLHDL 5.2    Hematology Recent Labs  Lab 12/27/21 2340 12/29/21 0457  WBC 6.3 8.4  RBC 4.20* 4.81  HGB 11.9* 13.4  HCT 36.4* 41.0  MCV 86.7 85.2  MCH 28.3 27.9  MCHC 32.7 32.7  RDW 13.0 13.2  PLT 194 224   Thyroid  Recent Labs  Lab 12/28/21 0700  TSH 2.390    BNP Recent Labs  Lab 12/28/21 0700  BNP 92.2    DDimer No results for input(s): "DDIMER" in the last 168 hours.   Radiology    ECHOCARDIOGRAM COMPLETE  Result Date: 12/28/2021    ECHOCARDIOGRAM REPORT   Patient Name:   Caleb Anderson Date of Exam: 12/28/2021 Medical Rec #:  [id number]        Height:       64.0 in Accession #:    [id number]       Weight:       190.0 lb Date of Birth:  1945-11-02        BSA:          1.914 m Patient Age:    76 years         BP:           129/58 mmHg Patient Gender: M                HR:           49 bpm. Exam Location:  Inpatient Procedure: 2D Echo, 3D Echo, Cardiac Doppler, Color Doppler and Intracardiac            Opacification Agent Indications:    Atrial Fibrillation I48.91  History:        Patient has no prior history of Echocardiogram examinations.                 NSTEMI and CAD, Arrythmias:Atrial Fibrillation and Bradycardia,                 Signs/Symptoms:Chest Pain; Risk Factors:Former Smoker,                 Dyslipidemia and Hypertension.  Sonographer:    Greer Pickerel Referring Phys: [id number] Bronson Curb  Sonographer Comments: Technically difficult study due to poor echo windows, suboptimal parasternal window and suboptimal apical window. Image acquisition challenging due to patient body habitus and Image  acquisition challenging due to respiratory motion. IMPRESSIONS  1. Left ventricular ejection fraction, by estimation, is 60 to 65%. Left ventricular ejection fraction by 3D volume is 60 %. The left ventricle has normal function. The left ventricle has no regional wall motion abnormalities. Left ventricular diastolic  parameters are indeterminate.  2. Right ventricular systolic function is normal. The right ventricular size is normal.  3. Left atrial size was moderately dilated.  4. The mitral valve is normal in structure. No evidence of mitral valve regurgitation. No evidence of mitral stenosis.  5. The aortic valve is normal in structure. Aortic valve regurgitation is not visualized. No aortic stenosis is present.  6. Aortic dilatation noted. There is mild dilatation of the aortic root, measuring 41 mm.  7. The inferior vena cava is normal in size with greater than 50% respiratory variability, suggesting right atrial pressure of 3 mmHg. FINDINGS  Left Ventricle: Left ventricular ejection fraction, by estimation, is 60 to 65%. Left ventricular ejection fraction by 3D volume is 60 %. The left ventricle has normal function. The left ventricle has no regional wall motion abnormalities. Definity contrast agent was given IV to delineate the left ventricular endocardial borders. The left ventricular internal cavity size was normal in size. There is no left ventricular hypertrophy. Left ventricular diastolic parameters are indeterminate. Right Ventricle: The right ventricular size is normal. No increase in right ventricular wall thickness. Right ventricular systolic function is normal. Left Atrium: Left atrial size was moderately dilated. Right Atrium: Right atrial size was normal in size. Pericardium: There is no  evidence of pericardial effusion. Mitral Valve: The mitral valve is normal in structure. No evidence of mitral valve regurgitation. No evidence of mitral valve stenosis. Tricuspid Valve: The tricuspid valve is  normal in structure. Tricuspid valve regurgitation is not demonstrated. No evidence of tricuspid stenosis. Aortic Valve: The aortic valve is normal in structure. Aortic valve regurgitation is not visualized. No aortic stenosis is present. Pulmonic Valve: The pulmonic valve was normal in structure. Pulmonic valve regurgitation is not visualized. No evidence of pulmonic stenosis. Aorta: Aortic dilatation noted. There is mild dilatation of the aortic root, measuring 41 mm. Venous: The inferior vena cava is normal in size with greater than 50% respiratory variability, suggesting right atrial pressure of 3 mmHg. IAS/Shunts: No atrial level shunt detected by color flow Doppler.  LEFT VENTRICLE PLAX 2D LVIDd:         3.90 cm         Diastology LVIDs:         2.90 cm         LV e' medial:    6.57 cm/s LV PW:         0.80 cm         LV E/e' medial:  13.9 LV IVS:        0.90 cm         LV e' lateral:   10.10 cm/s                                LV E/e' lateral: 9.0                                 3D Volume EF                                LV 3D EF:    Left                                             ventricul                                             ar                                             ejection                                             fraction                                             by 3D  volume is                                             60 %.                                 3D Volume EF:                                3D EF:        60 %                                LV EDV:       127 ml                                LV ESV:       51 ml                                LV SV:        75 ml RIGHT VENTRICLE RV S prime:     14.00 cm/s TAPSE (M-mode): 2.4 cm LEFT ATRIUM             Index        RIGHT ATRIUM           Index LA diam:        3.30 cm 1.72 cm/m   RA Area:     16.70 cm LA Vol (A2C):   91.7 ml 47.91 ml/m  RA Volume:   43.70 ml  22.83 ml/m LA Vol  (A4C):   47.3 ml 24.71 ml/m LA Biplane Vol: 68.9 ml 35.99 ml/m  AORTIC VALVE LVOT Vmax:   102.00 cm/s LVOT Vmean:  60.800 cm/s LVOT VTI:    0.223 m  AORTA Ao Root diam: 3.95 cm Ao Asc diam:  3.20 cm MITRAL VALVE MV Area (PHT): 3.77 cm    SHUNTS MV Decel Time: 201 msec    Systemic VTI: 0.22 m MV E velocity: 91.30 cm/s MV A velocity: 65.60 cm/s MV E/A ratio:  1.39 Candee Furbish MD Electronically signed by Candee Furbish MD Signature Date/Time: 12/28/2021/2:31:01 PM    Final    DG Chest Port 1 View  Result Date: 12/27/2021 CLINICAL DATA:  chest pain EXAM: PORTABLE CHEST 1 VIEW COMPARISON:  Chest x-ray 06/24/2008, CT chest 10/24/2015 FINDINGS: The heart and mediastinal contours are unchanged. Aortic calcification. No focal consolidation. No pulmonary edema. No pleural effusion. No pneumothorax. No acute osseous abnormality. IMPRESSION: 1. No active disease. 2.  Aortic Atherosclerosis (ICD10-I70.0). Electronically Signed   By: Iven Finn M.D.   On: 12/27/2021 23:48    Cardiac Studies   Echo 12/28/21: IMPRESSIONS     1. Left ventricular ejection fraction, by estimation, is 60 to 65%. Left  ventricular ejection fraction by 3D volume is 60 %. The left ventricle has  normal function. The left ventricle has no regional wall motion  abnormalities. Left ventricular diastolic   parameters are indeterminate.   2. Right ventricular systolic function is normal. The right ventricular  size is normal.   3. Left atrial size was moderately dilated.   4. The mitral valve is normal in structure. No evidence of mitral valve  regurgitation. No evidence of mitral stenosis.   5. The aortic valve is normal in structure. Aortic valve regurgitation is  not visualized. No aortic stenosis is present.   6. Aortic dilatation noted. There is mild dilatation of the aortic root,  measuring 41 mm.   7. The inferior vena cava is normal in size with greater than 50%  respiratory variability, suggesting right atrial pressure of  3 mmHg.   Patient Profile     76 y.o. male with glaucoma, HTN, HLD, coronary artery calcifications, BPH, anemia, presenting for evaluation of chest discomfort and irregular heart rate.  Assessment & Plan    Atrial Fibrillation with RVR- patient converted to NSR. Moderate LAE on Echo with normal LV function. Not a candidate for rate slowing medication with resting bradycardia. Plan to initiate oral anticoagulation once ischemic evaluation complete with [place] vasc score of 4.   NSTEMI- likely type 2 in setting of AFib but patient has high risk of CAD with heavy coronary calcification and risk factors. Had chest pain. Plan cardiac cath today.  CAD with known 3v Coronary Artery Calcifications- plan cardiac cath today -On discharge, start apixaban -no beta blocker given bradycardia  -Continue ASA '81mg'$  -Continue pravastatin (intollerant to other statins and zetia) -Given statin intolerance and CAD, would consider outpatient referral to lipid clinic. Goal LDL <70 -Telemetry   BPH -Continue home tamsulosin   Normocytic Anemia -Iron studies normal.    Arthritis -Hold home mobic   Glaucoma -Continue home drops   HLD -Continue home pravastatin   Aortic Atherosclerosis -Continue home pravastatin, ASA   Hypokalemia  HTN Hypokalemia likely 2/2 HCTZ.  -potassium repleted -continue lisinopril but hold HCTZ for now.  - BP currently well controlled.     For questions or updates, please contact [place] Please consult [email or web address] for contact info under        Signed, Kaneisha Ellenberger [place], MD  12/29/2021, 8:35 AM

## 2021-12-30 ENCOUNTER — Telehealth: Payer: Self-pay

## 2021-12-30 ENCOUNTER — Telehealth: Payer: Self-pay | Admitting: Cardiology

## 2021-12-30 ENCOUNTER — Other Ambulatory Visit (HOSPITAL_COMMUNITY): Payer: Self-pay

## 2021-12-30 ENCOUNTER — Telehealth: Payer: Self-pay | Admitting: Student

## 2021-12-30 DIAGNOSIS — I214 Non-ST elevation (NSTEMI) myocardial infarction: Principal | ICD-10-CM

## 2021-12-30 DIAGNOSIS — I480 Paroxysmal atrial fibrillation: Secondary | ICD-10-CM

## 2021-12-30 DIAGNOSIS — E7800 Pure hypercholesterolemia, unspecified: Secondary | ICD-10-CM

## 2021-12-30 LAB — BASIC METABOLIC PANEL
Anion gap: 10 (ref 5–15)
BUN: 14 mg/dL (ref 8–23)
CO2: 23 mmol/L (ref 22–32)
Calcium: 9.2 mg/dL (ref 8.9–10.3)
Chloride: 106 mmol/L (ref 98–111)
Creatinine, Ser: 1.18 mg/dL (ref 0.61–1.24)
GFR, Estimated: >60 mL/min (ref >60)
Glucose, Bld: 161 mg/dL — ABNORMAL HIGH (ref 70–99)
Potassium: 3.9 mmol/L (ref 3.5–5.1)
Sodium: 139 mmol/L (ref 135–145)

## 2021-12-30 LAB — CBC
HCT: 43.3 % (ref 39.0–52.0)
Hemoglobin: 14.4 g/dL (ref 13.0–17.0)
MCH: 28.2 pg (ref 26.0–34.0)
MCHC: 33.3 g/dL (ref 30.0–36.0)
MCV: 84.7 fL (ref 80.0–100.0)
Platelets: 251 10*3/uL (ref 150–400)
RBC: 5.11 MIL/uL (ref 4.22–5.81)
RDW: 13 % (ref 11.5–15.5)
WBC: 9.4 10*3/uL (ref 4.0–10.5)
nRBC: 0 % (ref 0.0–0.2)

## 2021-12-30 LAB — POCT ACTIVATED CLOTTING TIME: Activated Clotting Time: 414 seconds

## 2021-12-30 MED ORDER — AMLODIPINE BESYLATE 5 MG PO TABS
5.0000 mg | ORAL_TABLET | Freq: Every day | ORAL | 11 refills | Status: DC
  Filled 2021-12-30: qty 30, 30d supply, fill #0

## 2021-12-30 MED ORDER — APIXABAN 5 MG PO TABS
5.0000 mg | ORAL_TABLET | Freq: Two times a day (BID) | ORAL | 1 refills | Status: DC
  Filled 2021-12-30: qty 60, 30d supply, fill #0

## 2021-12-30 MED ORDER — APIXABAN 5 MG PO TABS
5.0000 mg | ORAL_TABLET | Freq: Every day | ORAL | 1 refills | Status: DC
  Filled 2021-12-30: qty 90, 90d supply, fill #0

## 2021-12-30 MED ORDER — CLOPIDOGREL BISULFATE 75 MG PO TABS
75.0000 mg | ORAL_TABLET | Freq: Every day | ORAL | 2 refills | Status: DC
  Filled 2021-12-30: qty 90, 90d supply, fill #0

## 2021-12-30 MED ORDER — CIPROFLOXACIN HCL 0.3 % OP SOLN
1.0000 [drp] | Freq: Four times a day (QID) | OPHTHALMIC | 0 refills | Status: AC
  Filled 2021-12-30: qty 5, 18d supply, fill #0

## 2021-12-30 MED ORDER — NITROGLYCERIN 0.4 MG SL SUBL
0.4000 mg | SUBLINGUAL_TABLET | SUBLINGUAL | 2 refills | Status: DC | PRN
  Filled 2021-12-30: qty 25, 7d supply, fill #0

## 2021-12-30 MED ORDER — PANTOPRAZOLE SODIUM 40 MG PO TBEC: 40.0000 mg | DELAYED_RELEASE_TABLET | Freq: Every day | ORAL | 11 refills | Status: DC

## 2021-12-30 MED ORDER — LISINOPRIL 20 MG PO TABS
20.0000 mg | ORAL_TABLET | Freq: Every day | ORAL | 1 refills | Status: DC
  Filled 2021-12-30: qty 90, 90d supply, fill #0

## 2021-12-30 NOTE — Discharge Summary (Addendum)
Discharge Summary    Patient ID: Caleb Anderson MRN: [id number]; DOB: 02/20/1946  Admit date: 12/27/2021 Discharge date: 12/30/2021  PCP:  Caleb Neer, MD   [place] [facility] Cardiologist:  Caleb Martinique, MD     Discharge Diagnoses    Principal Problem:   NSTEMI (non-ST elevated myocardial infarction) [facility]) Active Problems:   Essential hypertension   Hypercholesterolemia   PAF (paroxysmal atrial fibrillation) [facility])   Diagnostic Studies/Procedures    Cath: 12/29/21     Prox RCA to Mid RCA lesion is 30% stenosed.   RPDA lesion is 50% stenosed.   1st Mrg lesion is 40% stenosed.   Prox Cx to Mid Cx lesion is 70% stenosed.   3rd Mrg lesion is 60% stenosed.   Ost LAD to Mid LAD lesion is 80% stenosed.   1st Diag lesion is 60% stenosed.   A drug-eluting stent was successfully placed using a SYNERGY XD 3.0X24.   Post intervention, there is a 0% residual stenosis.   LV end diastolic pressure is mildly elevated.   Single vessel obstructive CAD. Diffuse CAD. Lesion in the LCx is not hemodynamically significant by FFR Mildly elevated LVEDP 16 mm Hg Successful PCI of the proximal LAD with Shockwave lithotripsy and DES x 1. Preserved flow in the first diagonal   Plan: DAPT with ASA for one month. Plavix for 12 months. Concomitant anticoagulation with Eliquis- may begin tonight. Anticipate DC tomorrow. Treat other CAD medically.   Diagnostic Dominance: Right  Intervention    Echo: 12/28/2021  IMPRESSIONS     1. Left ventricular ejection fraction, by estimation, is 60 to 65%. Left  ventricular ejection fraction by 3D volume is 60 %. The left ventricle has  normal function. The left ventricle has no regional wall motion  abnormalities. Left ventricular diastolic   parameters are indeterminate.   2. Right ventricular systolic function is normal. The right ventricular  size is normal.   3. Left atrial size was moderately dilated.   4. The mitral valve  is normal in structure. No evidence of mitral valve  regurgitation. No evidence of mitral stenosis.   5. The aortic valve is normal in structure. Aortic valve regurgitation is  not visualized. No aortic stenosis is present.   6. Aortic dilatation noted. There is mild dilatation of the aortic root,  measuring 41 mm.   7. The inferior vena cava is normal in size with greater than 50%  respiratory variability, suggesting right atrial pressure of 3 mmHg.   FINDINGS   Left Ventricle: Left ventricular ejection fraction, by estimation, is 60  to 65%. Left ventricular ejection fraction by 3D volume is 60 %. The left  ventricle has normal function. The left ventricle has no regional wall  motion abnormalities. Definity  contrast agent was given IV to delineate the left ventricular endocardial  borders. The left ventricular internal cavity size was normal in size.  There is no left ventricular hypertrophy. Left ventricular diastolic  parameters are indeterminate.   Right Ventricle: The right ventricular size is normal. No increase in  right ventricular wall thickness. Right ventricular systolic function is  normal.   Left Atrium: Left atrial size was moderately dilated.   Right Atrium: Right atrial size was normal in size.   Pericardium: There is no evidence of pericardial effusion.   Mitral Valve: The mitral valve is normal in structure. No evidence of  mitral valve regurgitation. No evidence of mitral valve stenosis.   Tricuspid Valve: The tricuspid valve  is normal in structure. Tricuspid  valve regurgitation is not demonstrated. No evidence of tricuspid  stenosis.   Aortic Valve: The aortic valve is normal in structure. Aortic valve  regurgitation is not visualized. No aortic stenosis is present.   Pulmonic Valve: The pulmonic valve was normal in structure. Pulmonic valve  regurgitation is not visualized. No evidence of pulmonic stenosis.   Aorta: Aortic dilatation noted. There is  mild dilatation of the aortic  root, measuring 41 mm.   Venous: The inferior vena cava is normal in size with greater than 50%  respiratory variability, suggesting right atrial pressure of 3 mmHg.   IAS/Shunts: No atrial level shunt detected by color flow Doppler.  _____________   History of Present Illness     Caleb Anderson is a 76 y.o. male with glaucoma, HTN, HLD, coronary artery calcifications, BPH, anemia, presenting for evaluation of chest discomfort and irregular heart rate.  Caleb Anderson was in his normal state of heath this evening watching TV with his family. He walked to the bathroom, and noticed pressure in his neck and upper chest that felt like "indigestion". He felt his pulse, which felt irregular and "not steady". Had associated mild shortness of breath, weakness, and felt overall uncomfortable. Denies associate n/v/radiation. He asked his daughter to feel his pulse, who also stated if felt irregular. He reports never having prior similar symptoms. Given continued uncomfortable feeling in his neck along with irregular pulse, him and his family drove up the street to their local fire department to get checked out.   Upon arrival there, EMS telemetry revealed HR between 150-200 with narrow complex tachycardia and irregular RR interval c/f atrial fibrillation. Per their run strip "M4 reports his family had driven him here for an evaluation of symptoms of heartburn, palpitations, weakness, and shortness of breath. She initially noted AFIB RVR at a rate between 150-200bpm on her monitor, she reports there were also 2 short runs of VTACH, each about 4 beats long. She had began an IV and was setting up diltiazem, when the patient suddenly converted on his own into a normal sinus rhythm. On M262 arrival, the patient was in said sinus rhythm with all symptoms also having resolved."   On presentation to the ED, troponins frended from 78 -> 146. EKGs here with NSR.   Hospital Course      Atrial Fibrillation with RVR -- patient converted to NSR. Moderate LAE on Echo with normal LV function.  With resting bradycardia he was not started on AV nodal agents.   -- Placed on Eliquis 5 mg twice daily post cath given CHA2DS2-VASc score of 4.  NSTEMI -- High-sensitivity troponin 78>> 146.  Underwent cardiac catheterization noted above with significant single-vessel CAD and PCI of proximal LAD with shockwave lithotripsy and DES x1.  Did have 70% lesion in left circumflex not hemodynamically significant by FFR.  Plan for DAPT with aspirin/Plavix along with concomitant anticoagulation with Eliquis for 1 month.  Plan to stop aspirin after 1 month.  Echocardiogram showed LVEF of 60 to 65%, normal RV size and function.  HLD -- LDL 113, HDL 33, LP(a) 18.6, multiple statin intolerances.  Has been seen in lipid clinic in the past but Repatha costly -- Continue home pravastatin   BPH -- Continue home tamsulosin   Normocytic Anemia -- Iron studies normal   Arthritis --Discussed the need to hold home mobic, he will attempt to do so.  Instructed okay to use Tylenol as substitute   Glaucoma --  Continue home drops   Hypokalemia -- Potassium 2.9 on admission, supplemented with improvement -- Hold HCTZ at discharge   HTN -- Overall well controlled -- Continue lisinopril 37m daily, along with amlodipine 535mdaily   Left eye redness/infection Conjunctivitis -- Start ciprofloxacin drops at discharge with instructions to follow-up with ophthalmologist  General: Well developed, well nourished, male appearing in no acute distress. Head: Normocephalic, atraumatic.  Neck: Supple without bruits, JVD. Lungs:  Resp regular and unlabored, CTA. Heart: RRR, S1, S2, no S3, S4, or murmur; no rub. Abdomen: Soft, non-tender, non-distended with normoactive bowel sounds. No hepatomegaly. No rebound/guarding. No obvious abdominal masses. Extremities: No clubbing, cyanosis, edema. Distal pedal pulses are  2+ bilaterally. Right radial cath site stable without bruising or hematoma Neuro: Alert and oriented X 3. Moves all extremities spontaneously. Psych: Normal affect.  Did the patient have an acute coronary syndrome (MI, NSTEMI, STEMI, etc) this admission?:  Yes                               AHA/ACC Clinical Performance & Quality Measures: Aspirin prescribed? - Yes ADP Receptor Inhibitor (Plavix/Clopidogrel, Brilinta/Ticagrelor or Effient/Prasugrel) prescribed (includes medically managed patients)? - Yes Beta Blocker prescribed? - No - bradycardia High Intensity Statin (Lipitor 40-8024mr Crestor 20-70m19mrescribed? - No - intolerant EF assessed during THIS hospitalization? - Yes For EF <40%, was ACEI/ARB prescribed? - Not Applicable (EF >/= 40%)29%r EF <40%, Aldosterone Antagonist (Spironolactone or Eplerenone) prescribed? - Not Applicable (EF >/= 40%)02%rdiac Rehab Phase II ordered (including medically managed patients)? - Yes    The patient will be scheduled for a TOC follow up appointment in 10-14 days.  A message has been sent to the TOC [facility] Scheduling Pool at the office where the patient should be seen for follow up.  _____________  Discharge Vitals Blood pressure (!) 126/54, pulse (!) 53, temperature 98.5 F (36.9 C), temperature source Oral, resp. rate 18, height _0  (1.905 m), weight 111.1 kg, SpO2 98 %.  Filed Weights   12/29/21 0506 12/29/21 0938 12/29/21 0945  Weight: 80.7 kg 82.8 kg 111.1 kg    Labs & Radiologic Studies    CBC Recent Labs    12/27/21 2340 12/29/21 0457 12/30/21 0730  WBC 6.3 8.4 9.4  NEUTROABS 2.7  --   --   HGB 11.9* 13.4 14.4  HCT 36.4* 41.0 43.3  MCV 86.7 85.2 84.7  PLT 194 224 251 111asic Metabolic Panel Recent Labs    12/27/21 2340 12/28/21 1612 12/29/21 0457 12/30/21 0730  NA 142   < > 136 139  K 2.9*   < > 3.9 3.9  CL 117*   < > 108 106  CO2 20*   < > 21* 23  GLUCOSE 92   < > 115* 161*  BUN 15   < > 20 14  CREATININE  0.82   < > 0.96 1.18  CALCIUM 6.8*   < > 8.1* 9.2  MG 1.7  --   --   --    < > = values in this interval not displayed.   Liver Function Tests Recent Labs    12/27/21 2340  AST 19  ALT 18  ALKPHOS 49  BILITOT 0.7  PROT 4.7*  ALBUMIN 2.6*   No results for input(s): "LIPASE", "AMYLASE" in the last 72 hours. High Sensitivity Troponin:   Recent Labs  Lab 12/27/21 2340 12/28/21 0138  TROPONINIHS 78* 146*    BNP Invalid input(s): "POCBNP" D-Dimer No results for input(s): "DDIMER" in the last 72 hours. Hemoglobin A1C Recent Labs    12/28/21 0700  HGBA1C 5.8*   Fasting Lipid Panel Recent Labs    12/28/21 0700  CHOL 173  HDL 33*  LDLCALC 113*  TRIG 133  CHOLHDL 5.2   Thyroid Function Tests Recent Labs    12/28/21 0700  TSH 2.390   _____________  CARDIAC CATHETERIZATION  Result Date: 12/29/2021   Prox RCA to Mid RCA lesion is 30% stenosed.   RPDA lesion is 50% stenosed.   1st Mrg lesion is 40% stenosed.   Prox Cx to Mid Cx lesion is 70% stenosed.   3rd Mrg lesion is 60% stenosed.   Ost LAD to Mid LAD lesion is 80% stenosed.   1st Diag lesion is 60% stenosed.   A drug-eluting stent was successfully placed using a SYNERGY XD 3.0X24.   Post intervention, there is a 0% residual stenosis.   LV end diastolic pressure is mildly elevated. Single vessel obstructive CAD. Diffuse CAD. Lesion in the LCx is not hemodynamically significant by FFR Mildly elevated LVEDP 16 mm Hg Successful PCI of the proximal LAD with Shockwave lithotripsy and DES x 1. Preserved flow in the first diagonal Plan: DAPT with ASA for one month. Plavix for 12 months. Concomitant anticoagulation with Eliquis- may begin tonight. Anticipate DC tomorrow. Treat other CAD medically.   ECHOCARDIOGRAM COMPLETE  Result Date: 12/28/2021    ECHOCARDIOGRAM REPORT   Patient Name:   GUENTHER DUNSHEE [place] Date of Exam: 12/28/2021 Medical Rec #:  [id number]        Height:       64.0 in Accession #:    [id number]       Weight:        190.0 lb Date of Birth:  1945-08-11        BSA:          1.914 m Patient Age:    64 years         BP:           129/58 mmHg Patient Gender: M                HR:           49 bpm. Exam Location:  Inpatient Procedure: 2D Echo, 3D Echo, Cardiac Doppler, Color Doppler and Intracardiac            Opacification Agent Indications:    Atrial Fibrillation I48.91  History:        Patient has no prior history of Echocardiogram examinations.                 NSTEMI and CAD, Arrythmias:Atrial Fibrillation and Bradycardia,                 Signs/Symptoms:Chest Pain; Risk Factors:Former Smoker,                 Dyslipidemia and Hypertension.  Sonographer:    Greer Pickerel Referring Phys: [id number] Bronson Curb  Sonographer Comments: Technically difficult study due to poor echo windows, suboptimal parasternal window and suboptimal apical window. Image acquisition challenging due to patient body habitus and Image acquisition challenging due to respiratory motion. IMPRESSIONS  1. Left ventricular ejection fraction, by estimation, is 60 to 65%. Left ventricular ejection fraction by 3D volume is 60 %. The left ventricle has normal function. The left ventricle has no regional wall motion abnormalities. Left  ventricular diastolic  parameters are indeterminate.  2. Right ventricular systolic function is normal. The right ventricular size is normal.  3. Left atrial size was moderately dilated.  4. The mitral valve is normal in structure. No evidence of mitral valve regurgitation. No evidence of mitral stenosis.  5. The aortic valve is normal in structure. Aortic valve regurgitation is not visualized. No aortic stenosis is present.  6. Aortic dilatation noted. There is mild dilatation of the aortic root, measuring 41 mm.  7. The inferior vena cava is normal in size with greater than 50% respiratory variability, suggesting right atrial pressure of 3 mmHg. FINDINGS  Left Ventricle: Left ventricular ejection fraction, by estimation, is 60 to  65%. Left ventricular ejection fraction by 3D volume is 60 %. The left ventricle has normal function. The left ventricle has no regional wall motion abnormalities. Definity contrast agent was given IV to delineate the left ventricular endocardial borders. The left ventricular internal cavity size was normal in size. There is no left ventricular hypertrophy. Left ventricular diastolic parameters are indeterminate. Right Ventricle: The right ventricular size is normal. No increase in right ventricular wall thickness. Right ventricular systolic function is normal. Left Atrium: Left atrial size was moderately dilated. Right Atrium: Right atrial size was normal in size. Pericardium: There is no evidence of pericardial effusion. Mitral Valve: The mitral valve is normal in structure. No evidence of mitral valve regurgitation. No evidence of mitral valve stenosis. Tricuspid Valve: The tricuspid valve is normal in structure. Tricuspid valve regurgitation is not demonstrated. No evidence of tricuspid stenosis. Aortic Valve: The aortic valve is normal in structure. Aortic valve regurgitation is not visualized. No aortic stenosis is present. Pulmonic Valve: The pulmonic valve was normal in structure. Pulmonic valve regurgitation is not visualized. No evidence of pulmonic stenosis. Aorta: Aortic dilatation noted. There is mild dilatation of the aortic root, measuring 41 mm. Venous: The inferior vena cava is normal in size with greater than 50% respiratory variability, suggesting right atrial pressure of 3 mmHg. IAS/Shunts: No atrial level shunt detected by color flow Doppler.  LEFT VENTRICLE PLAX 2D LVIDd:         3.90 cm         Diastology LVIDs:         2.90 cm         LV e' medial:    6.57 cm/s LV PW:         0.80 cm         LV E/e' medial:  13.9 LV IVS:        0.90 cm         LV e' lateral:   10.10 cm/s                                LV E/e' lateral: 9.0                                 3D Volume EF                                 LV 3D EF:    Left  ventricul                                             ar                                             ejection                                             fraction                                             by 3D                                             volume is                                             60 %.                                 3D Volume EF:                                3D EF:        60 %                                LV EDV:       127 ml                                LV ESV:       51 ml                                LV SV:        75 ml RIGHT VENTRICLE RV S prime:     14.00 cm/s TAPSE (M-mode): 2.4 cm LEFT ATRIUM             Index        RIGHT ATRIUM           Index LA diam:        3.30 cm 1.72 cm/m   RA Area:     16.70 cm LA Vol (A2C):   91.7 ml 47.91 ml/m  RA Volume:   43.70 ml  22.83 ml/m LA Vol (A4C):   47.3 ml 24.71 ml/m LA Biplane Vol: 68.9 ml 35.99 ml/m  AORTIC VALVE LVOT Vmax:   102.00 cm/s LVOT Vmean:  60.800 cm/s LVOT VTI:  0.223 m  AORTA Ao Root diam: 3.95 cm Ao Asc diam:  3.20 cm MITRAL VALVE MV Area (PHT): 3.77 cm    SHUNTS MV Decel Time: 201 msec    Systemic VTI: 0.22 m MV E velocity: 91.30 cm/s MV A velocity: 65.60 cm/s MV E/A ratio:  1.39 Candee Furbish MD Electronically signed by Candee Furbish MD Signature Date/Time: 12/28/2021/2:31:01 PM    Final    DG Chest Port 1 View  Result Date: 12/27/2021 CLINICAL DATA:  chest pain EXAM: PORTABLE CHEST 1 VIEW COMPARISON:  Chest x-ray 06/24/2008, CT chest 10/24/2015 FINDINGS: The heart and mediastinal contours are unchanged. Aortic calcification. No focal consolidation. No pulmonary edema. No pleural effusion. No pneumothorax. No acute osseous abnormality. IMPRESSION: 1. No active disease. 2.  Aortic Atherosclerosis (ICD10-I70.0). Electronically Signed   By: Iven Finn M.D.   On: 12/27/2021 23:48   Disposition   Pt is being discharged home today in good  condition.  Follow-up Plans & Appointments     Follow-up Information     Ledora Bottcher, [place] Follow up on 01/06/2022.   Specialties: Physician Assistant, Cardiology, Radiology Why: at 1:30pm for your follow up appt with Dr. Morrison Old' PA Angie Contact information: [address] [place] [address] [phone]         Caleb Neer, MD Follow up.   Specialty: Family Medicine Why: Please follow up with PCP or opthalmologist regarding further treatment of your left eye Contact information: [address] [place] [address] [phone]                Discharge Instructions     Amb Referral to Cardiac Rehabilitation   Complete by: As directed    Diagnosis:  Coronary Stents NSTEMI PTCA     After initial evaluation and assessments completed: Virtual Based Care may be provided alone or in conjunction with Phase 2 Cardiac Rehab based on patient barriers.: Yes   Intensive Cardiac Rehabilitation (ICR) [place] location only OR Traditional Cardiac Rehabilitation (TCR) *If criteria for ICR are not met will enroll in TCR [facility] only): Yes   Call MD for:  difficulty breathing, headache or visual disturbances   Complete by: As directed    Call MD for:  persistant dizziness or light-headedness   Complete by: As directed    Call MD for:  redness, tenderness, or signs of infection (pain, swelling, redness, odor or green/yellow discharge around incision site)   Complete by: As directed    Diet - low sodium heart healthy   Complete by: As directed    Discharge instructions   Complete by: As directed    Radial Site Care Refer to this sheet in the next few weeks. These instructions provide you with information on caring for yourself after your procedure. Your caregiver may also give you more specific instructions. Your treatment has been planned according to current medical practices, but problems sometimes occur. Call your caregiver if you have any  problems or questions after your procedure. HOME CARE INSTRUCTIONS You may shower the day after the procedure. Remove the bandage (dressing) and gently wash the site with plain soap and water. Gently pat the site dry.  Do not apply powder or lotion to the site.  Do not submerge the affected site in water for 3 to 5 days.  Inspect the site at least twice daily.  Do not flex or bend the affected arm for 24 hours.  No lifting over 5 pounds (2.3 kg) for  5 days after your procedure.  Do not drive home if you are discharged the same day of the procedure. Have someone else drive you.  You may drive 24 hours after the procedure unless otherwise instructed by your caregiver.  What to expect: Any bruising will usually fade within 1 to 2 weeks.  Blood that collects in the tissue (hematoma) may be painful to the touch. It should usually decrease in size and tenderness within 1 to 2 weeks.  SEEK IMMEDIATE MEDICAL CARE IF: You have unusual pain at the radial site.  You have redness, warmth, swelling, or pain at the radial site.  You have drainage (other than a small amount of blood on the dressing).  You have chills.  You have a fever or persistent symptoms for more than 72 hours.  You have a fever and your symptoms suddenly get worse.  Your arm becomes pale, cool, tingly, or numb.  You have heavy bleeding from the site. Hold pressure on the site.   PLEASE DO NOT MISS ANY DOSES OF YOUR PLAVIX!!!!! Also keep a log of you blood pressures and bring back to your follow up appt. Please call the office with any questions.   Patients taking blood thinners should generally stay away from medicines like ibuprofen, Advil, Motrin, naproxen, and Aleve due to risk of stomach bleeding. You may take Tylenol as directed or talk to your primary doctor about alternatives.   PLEASE ENSURE THAT YOU DO NOT RUN OUT OF YOUR PLAVIX. This medication is very important to remain on for at least one year. IF you have issues  obtaining this medication due to cost please CALL the office 3-5 business days prior to running out in order to prevent missing doses of this medication.   You will be on aspirin, plavix and Eliquid for one month, then stop the aspirin but continue plavix and Eliquis.   Increase activity slowly   Complete by: As directed         Discharge Medications   Allergies as of 12/30/2021       Reactions   Fish Oil    Weight gain   Statins Other (See Comments)   Joint pain and fatigue(Lipitor, crestor, simvastatin, and pravastatin)   Trilipix [choline Fenofibrate] Other (See Comments)   Joint pain/fatigue   Welchol [colesevelam Hcl]    Aches   Zetia [ezetimibe] Other (See Comments)   Joint pain/fatigue        Medication List     STOP taking these medications    ibuprofen 600 MG tablet Commonly known as: ADVIL   lisinopril-hydrochlorothiazide 20-12.5 MG tablet Commonly known as: ZESTORETIC   meloxicam 15 MG tablet Commonly known as: MOBIC       TAKE these medications    acetaminophen 500 MG tablet Commonly known as: TYLENOL Take 500-1,000 mg by mouth every 6 (six) hours as needed for moderate pain.   acetaminophen-codeine 300-30 MG tablet Commonly known as: TYLENOL #3 Take 1 tablet by mouth every 4 (four) hours as needed for moderate pain or severe pain.   amLODipine 5 MG tablet Commonly known as: NORVASC Take 1 tablet (5 mg total) by mouth daily.   apixaban 5 MG Tabs tablet Commonly known as: ELIQUIS Take 1 tablet (5 mg total) by mouth 2 (two) times daily.   aspirin 81 MG tablet Take 81 mg by mouth daily.   ciprofloxacin 0.3 % ophthalmic solution Commonly known as: CILOXAN Place 1 drop into the left eye 4 (four) times daily  for 5 days.   clopidogrel 75 MG tablet Commonly known as: PLAVIX Take 1 tablet (75 mg total) by mouth daily. Start taking on: December 31, 2021   dorzolamide-timolol 22.3-6.8 MG/ML ophthalmic solution Commonly known as:  COSOPT Place 1 drop into both eyes at bedtime.   Fish Oil 1000 MG Caps Take 2,000 mg by mouth daily.   lisinopril 20 MG tablet Commonly known as: ZESTRIL Take 1 tablet (20 mg total) by mouth daily. Start taking on: December 31, 2021   nitroGLYCERIN 0.4 MG SL tablet Commonly known as: NITROSTAT Place 1 tablet (0.4 mg total) under the tongue every 5 (five) minutes x 3 doses as needed for chest pain.   pravastatin 10 MG tablet Commonly known as: PRAVACHOL Take 10 mg by mouth daily.   tamsulosin 0.4 MG Caps capsule Commonly known as: FLOMAX Take 0.4 mg by mouth every morning.           Outstanding Labs/Studies   BMET at follow up appt  Duration of Discharge Encounter   Greater than 30 minutes including physician time.  Signed, Reino Bellis, NP 12/30/2021, 11:33 AM

## 2021-12-30 NOTE — Discharge Instructions (Signed)

## 2021-12-30 NOTE — Progress Notes (Signed)
CARDIAC REHAB PHASE I   PRE:  Rate/Rhythm: 42 SB    BP: sitting 172/79    SaO2: 98 RA  MODE:  Ambulation: 430 ft   POST:  Rate/Rhythm: 72 SR    BP: sitting 202/74, recheck 30 min later 124/88     SaO2: 99 RA   Tolerated well, no c/o. BP elevated before and after walk. Pt had taken meds 1 1/2 hrs earlier. Recheck 30 min later better. Encouraged pt to take BP at home and log.  Discussed with pt and family MI, stent, restrictions, Plavix importance, diet (esp sodium), exercise, NTG and CRPII. Pt and family receptive, many questions answered. Will refer to [facility], pt is currently thinking about program. He does like to be active.  [id number]  Yves Dill BS, ACSM-CEP 12/30/2021 11:20 AM

## 2021-12-30 NOTE — Plan of Care (Signed)
Problem: Education: Goal: Understanding of cardiac disease, CV risk reduction, and recovery process will improve 12/30/2021 1134 by Salina April, RN Outcome: Completed/Met 12/30/2021 0934 by Salina April, RN Outcome: Progressing 12/30/2021 0933 by Salina April, RN Outcome: Progressing Goal: Individualized Educational Video(s) 12/30/2021 1134 by Salina April, RN Outcome: Completed/Met 12/30/2021 0934 by Salina April, RN Outcome: Progressing 12/30/2021 0933 by Salina April, RN Outcome: Progressing   Problem: Activity: Goal: Ability to tolerate increased activity will improve 12/30/2021 1134 by Salina April, RN Outcome: Completed/Met 12/30/2021 0934 by Salina April, RN Outcome: Progressing 12/30/2021 0933 by Salina April, RN Outcome: Progressing   Problem: Cardiac: Goal: Ability to achieve and maintain adequate cardiovascular perfusion will improve 12/30/2021 1134 by Salina April, RN Outcome: Completed/Met 12/30/2021 0934 by Salina April, RN Outcome: Progressing 12/30/2021 0933 by Salina April, RN Outcome: Progressing   Problem: Health Behavior/Discharge Planning: Goal: Ability to safely manage health-related needs after discharge will improve 12/30/2021 1134 by Salina April, RN Outcome: Completed/Met 12/30/2021 0934 by Salina April, RN Outcome: Progressing 12/30/2021 0933 by Salina April, RN Outcome: Progressing   Problem: Education: Goal: Understanding of CV disease, CV risk reduction, and recovery process will improve 12/30/2021 1134 by Salina April, RN Outcome: Completed/Met 12/30/2021 0934 by Salina April, RN Outcome: Progressing 12/30/2021 0933 by Salina April, RN Outcome: Progressing Goal: Individualized Educational Video(s) 12/30/2021 1134 by Salina April, RN Outcome: Completed/Met 12/30/2021 0934 by Salina April, RN Outcome: Progressing 12/30/2021 0933 by Salina April, RN Outcome: Progressing   Problem: Activity: Goal: Ability  to return to baseline activity level will improve 12/30/2021 1134 by Salina April, RN Outcome: Completed/Met 12/30/2021 0934 by Salina April, RN Outcome: Progressing 12/30/2021 0933 by Salina April, RN Outcome: Progressing   Problem: Cardiovascular: Goal: Ability to achieve and maintain adequate cardiovascular perfusion will improve 12/30/2021 1134 by Salina April, RN Outcome: Completed/Met 12/30/2021 0934 by Salina April, RN Outcome: Progressing 12/30/2021 0933 by Salina April, RN Outcome: Progressing Goal: Vascular access site(s) Level 0-1 will be maintained 12/30/2021 1134 by Salina April, RN Outcome: Completed/Met 12/30/2021 0934 by Salina April, RN Outcome: Progressing 12/30/2021 0933 by Salina April, RN Outcome: Progressing   Problem: Health Behavior/Discharge Planning: Goal: Ability to safely manage health-related needs after discharge will improve 12/30/2021 1134 by Salina April, RN Outcome: Completed/Met 12/30/2021 0934 by Salina April, RN Outcome: Progressing 12/30/2021 0933 by Salina April, RN Outcome: Progressing   Problem: Education: Goal: Knowledge of General Education information will improve Description: Including pain rating scale, medication(s)/side effects and non-pharmacologic comfort measures 12/30/2021 1134 by Salina April, RN Outcome: Completed/Met 12/30/2021 0934 by Salina April, RN Outcome: Progressing 12/30/2021 0933 by Salina April, RN Outcome: Progressing   Problem: Health Behavior/Discharge Planning: Goal: Ability to manage health-related needs will improve 12/30/2021 1134 by Salina April, RN Outcome: Completed/Met 12/30/2021 0934 by Salina April, RN Outcome: Progressing 12/30/2021 0933 by Salina April, RN Outcome: Progressing   Problem: Clinical Measurements: Goal: Ability to maintain clinical measurements within normal limits will improve 12/30/2021 1134 by Salina April, RN Outcome: Completed/Met 12/30/2021 0934  by Salina April, RN Outcome: Progressing 12/30/2021 0933 by Salina April, RN Outcome: Progressing Goal: Will remain free from infection 12/30/2021 1134 by Salina April, RN Outcome: Completed/Met 12/30/2021 0934 by Salina April, RN Outcome: Progressing 12/30/2021 0933 by Abbie Sons  Craig Guess, RN Outcome: Progressing Goal: Diagnostic test results will improve 12/30/2021 1134 by Salina April, RN Outcome: Completed/Met 12/30/2021 0934 by Salina April, RN Outcome: Progressing 12/30/2021 0933 by Salina April, RN Outcome: Progressing Goal: Respiratory complications will improve 12/30/2021 1134 by Salina April, RN Outcome: Completed/Met 12/30/2021 0934 by Salina April, RN Outcome: Progressing 12/30/2021 0933 by Salina April, RN Outcome: Progressing Goal: Cardiovascular complication will be avoided 12/30/2021 1134 by Salina April, RN Outcome: Completed/Met 12/30/2021 0934 by Salina April, RN Outcome: Progressing 12/30/2021 0933 by Salina April, RN Outcome: Progressing   Problem: Activity: Goal: Risk for activity intolerance will decrease 12/30/2021 1134 by Salina April, RN Outcome: Completed/Met 12/30/2021 0934 by Salina April, RN Outcome: Progressing 12/30/2021 0933 by Salina April, RN Outcome: Progressing   Problem: Nutrition: Goal: Adequate nutrition will be maintained 12/30/2021 1134 by Salina April, RN Outcome: Completed/Met 12/30/2021 0934 by Salina April, RN Outcome: Progressing 12/30/2021 0933 by Salina April, RN Outcome: Progressing   Problem: Coping: Goal: Level of anxiety will decrease 12/30/2021 1134 by Salina April, RN Outcome: Completed/Met 12/30/2021 0934 by Salina April, RN Outcome: Progressing 12/30/2021 0933 by Salina April, RN Outcome: Progressing   Problem: Elimination: Goal: Will not experience complications related to bowel motility 12/30/2021 1134 by Salina April, RN Outcome: Completed/Met 12/30/2021 0934 by Salina April,  RN Outcome: Progressing 12/30/2021 0933 by Salina April, RN Outcome: Progressing Goal: Will not experience complications related to urinary retention 12/30/2021 1134 by Salina April, RN Outcome: Completed/Met 12/30/2021 0934 by Salina April, RN Outcome: Progressing 12/30/2021 0933 by Salina April, RN Outcome: Progressing   Problem: Pain Managment: Goal: General experience of comfort will improve 12/30/2021 1134 by Salina April, RN Outcome: Completed/Met 12/30/2021 0934 by Salina April, RN Outcome: Progressing 12/30/2021 0933 by Salina April, RN Outcome: Progressing   Problem: Safety: Goal: Ability to remain free from injury will improve 12/30/2021 1134 by Salina April, RN Outcome: Completed/Met 12/30/2021 0934 by Salina April, RN Outcome: Progressing 12/30/2021 0933 by Salina April, RN Outcome: Progressing   Problem: Skin Integrity: Goal: Risk for impaired skin integrity will decrease 12/30/2021 1134 by Salina April, RN Outcome: Completed/Met 12/30/2021 0934 by Salina April, RN Outcome: Progressing 12/30/2021 0933 by Salina April, RN Outcome: Progressing

## 2021-12-30 NOTE — Telephone Encounter (Signed)
Pt c/o medication issue:  1. Name of Medication: Esomeerazhole   2. How are you currently taking this medication (dosage and times per day)?    3. Are you having a reaction (difficulty breathing--STAT)? no  4. What is your medication issue? Patient is being discharge from the [facility]. And the wife is just make sure he is able to take this.

## 2021-12-30 NOTE — Telephone Encounter (Signed)
-----   Message from Cheryln Manly, NP sent at 12/30/2021 11:29 AM EDT ----- Regarding: TOC call Needs TOC call

## 2021-12-30 NOTE — Plan of Care (Signed)
Problem: Education: Goal: Understanding of cardiac disease, CV risk reduction, and recovery process will improve 12/30/2021 0934 by Salina April, RN Outcome: Progressing 12/30/2021 0933 by Salina April, RN Outcome: Progressing Goal: Individualized Educational Video(s) 12/30/2021 0934 by Salina April, RN Outcome: Progressing 12/30/2021 0933 by Salina April, RN Outcome: Progressing   Problem: Activity: Goal: Ability to tolerate increased activity will improve 12/30/2021 0934 by Salina April, RN Outcome: Progressing 12/30/2021 0933 by Salina April, RN Outcome: Progressing   Problem: Cardiac: Goal: Ability to achieve and maintain adequate cardiovascular perfusion will improve 12/30/2021 0934 by Salina April, RN Outcome: Progressing 12/30/2021 0933 by Salina April, RN Outcome: Progressing   Problem: Health Behavior/Discharge Planning: Goal: Ability to safely manage health-related needs after discharge will improve 12/30/2021 0934 by Salina April, RN Outcome: Progressing 12/30/2021 0933 by Salina April, RN Outcome: Progressing   Problem: Education: Goal: Understanding of CV disease, CV risk reduction, and recovery process will improve 12/30/2021 0934 by Salina April, RN Outcome: Progressing 12/30/2021 0933 by Salina April, RN Outcome: Progressing Goal: Individualized Educational Video(s) 12/30/2021 0934 by Salina April, RN Outcome: Progressing 12/30/2021 0933 by Salina April, RN Outcome: Progressing   Problem: Activity: Goal: Ability to return to baseline activity level will improve 12/30/2021 0934 by Salina April, RN Outcome: Progressing 12/30/2021 0933 by Salina April, RN Outcome: Progressing   Problem: Cardiovascular: Goal: Ability to achieve and maintain adequate cardiovascular perfusion will improve 12/30/2021 0934 by Salina April, RN Outcome: Progressing 12/30/2021 0933 by Salina April, RN Outcome: Progressing Goal: Vascular access site(s)  Level 0-1 will be maintained 12/30/2021 0934 by Salina April, RN Outcome: Progressing 12/30/2021 0933 by Salina April, RN Outcome: Progressing   Problem: Health Behavior/Discharge Planning: Goal: Ability to safely manage health-related needs after discharge will improve 12/30/2021 0934 by Salina April, RN Outcome: Progressing 12/30/2021 0933 by Salina April, RN Outcome: Progressing   Problem: Education: Goal: Knowledge of General Education information will improve Description: Including pain rating scale, medication(s)/side effects and non-pharmacologic comfort measures 12/30/2021 0934 by Salina April, RN Outcome: Progressing 12/30/2021 0933 by Salina April, RN Outcome: Progressing   Problem: Health Behavior/Discharge Planning: Goal: Ability to manage health-related needs will improve 12/30/2021 0934 by Salina April, RN Outcome: Progressing 12/30/2021 0933 by Salina April, RN Outcome: Progressing   Problem: Clinical Measurements: Goal: Ability to maintain clinical measurements within normal limits will improve 12/30/2021 0934 by Salina April, RN Outcome: Progressing 12/30/2021 0933 by Salina April, RN Outcome: Progressing Goal: Will remain free from infection 12/30/2021 0934 by Salina April, RN Outcome: Progressing 12/30/2021 0933 by Salina April, RN Outcome: Progressing Goal: Diagnostic test results will improve 12/30/2021 0934 by Salina April, RN Outcome: Progressing 12/30/2021 0933 by Salina April, RN Outcome: Progressing Goal: Respiratory complications will improve 12/30/2021 0934 by Salina April, RN Outcome: Progressing 12/30/2021 0933 by Salina April, RN Outcome: Progressing Goal: Cardiovascular complication will be avoided 12/30/2021 0934 by Salina April, RN Outcome: Progressing 12/30/2021 0933 by Salina April, RN Outcome: Progressing   Problem: Activity: Goal: Risk for activity intolerance will decrease 12/30/2021 0934 by Salina April, RN Outcome: Progressing 12/30/2021 0933 by Salina April, RN Outcome: Progressing   Problem: Nutrition: Goal: Adequate nutrition will be maintained 12/30/2021 0934 by Salina April, RN Outcome: Progressing 12/30/2021 0933 by Salina April, RN Outcome: Progressing  Problem: Coping: Goal: Level of anxiety will decrease 12/30/2021 0934 by Salina April, RN Outcome: Progressing 12/30/2021 0933 by Salina April, RN Outcome: Progressing   Problem: Elimination: Goal: Will not experience complications related to bowel motility 12/30/2021 0934 by Salina April, RN Outcome: Progressing 12/30/2021 0933 by Salina April, RN Outcome: Progressing Goal: Will not experience complications related to urinary retention 12/30/2021 0934 by Salina April, RN Outcome: Progressing 12/30/2021 0933 by Salina April, RN Outcome: Progressing   Problem: Pain Managment: Goal: General experience of comfort will improve 12/30/2021 0934 by Salina April, RN Outcome: Progressing 12/30/2021 0933 by Salina April, RN Outcome: Progressing   Problem: Safety: Goal: Ability to remain free from injury will improve 12/30/2021 0934 by Salina April, RN Outcome: Progressing 12/30/2021 0933 by Salina April, RN Outcome: Progressing   Problem: Skin Integrity: Goal: Risk for impaired skin integrity will decrease 12/30/2021 0934 by Salina April, RN Outcome: Progressing 12/30/2021 0933 by Salina April, RN Outcome: Progressing

## 2021-12-30 NOTE — Telephone Encounter (Signed)
Pt called stating before hospital admission he was taking esomeprazole daily and forget to make them aware. Pt is calling inquiring if he is ok to resume based on new medication changes.  Will forward to MD

## 2021-12-30 NOTE — Telephone Encounter (Signed)
Telephone encounter:  Patient's family called to verify that he is on the right medications given that his height and weight are different than the chart. His actual height is 5'5" and weight is 180 lbs.  Went through medications that he is prescribed: Plavix 75 mg Lisinopril 20 mg Pravastatin Amlodipine  Eliquis 5 mg BID  He does not qualify for reduced dose apixaban because his weight is >60 kg and age is <75 years old. Discussed this with his family.  Loel Dubonnet, MD MPH [facility]

## 2021-12-30 NOTE — Telephone Encounter (Signed)
Patient contacted regarding discharge from [facility] on 12/30/21.  Patient understands to follow up with provider [facility] on 01/06/22 at 1:30 pm at [facility]. Patient understands discharge instructions? yes Patient understands medications and regiment? yes Patient understands to bring all medications to this visit? yes

## 2021-12-30 NOTE — Telephone Encounter (Signed)
Pt made aware and verbalized understanding.  Medication sent to pharmacy as requested  Martinique, Peter M, MD  You; Golden Hurter D, LPN 36 minutes ago (9:24 PM)    We did not have that listed on his meds. Given interaction of this with Plavix would favor switching him to Protonix 40 mg daily   Peter Martinique MD, [facility]

## 2022-01-03 NOTE — Progress Notes (Unsigned)
Cardiology Office Note:    Date:  01/06/2022   ID:  Caleb Anderson, DOB 04/02/1945, MRN [id number]  PCP:  Mayra Neer, MD   [facility] Providers Cardiologist:  Peter Martinique, MD Cardiology APP:  Ledora Bottcher, [place] { Referring MD: Mayra Neer, MD   Chief Complaint  Patient presents with   Follow-up  NSTEMI  History of Present Illness:    Caleb Anderson is a 76 y.o. male with a hx of CAD, hypertension, hyperlipidemia, BPH, anemia, glaucoma, and newly recognized A-fib.  He has known coronary artery calcifications on CT and has been intolerant to multiple statins.  PCSK9 inhibitor was cost prohibitive in the past (was either his deductible or in the donut hole).  Previously seen by Dr. Radford Pax.  Nuclear stress test nonischemic in 2013.  He was hospitalized 10/8 - 12/30/21 with NSTEMI found to have new A-fib RVR.  RVR rates were in the 1 50-200 range.  She spontaneously converted to sinus bradycardia. HS trended to 146 and she underwent heart catheterization 12/29/2021 found to have single-vessel obstructive CAD in the proximal LAD treated with lithotripsy and DES times one 3.0 x 24 mm.  CAD in the LCx was not hemodynamically significant by FFR.  LVEDP was 16 mmHg.  Echocardiogram revealed LVEF 65 to 65%, normal RV, no significant valvular disease.  She does have an aortic root dilation of 41 mm. She was treated with triple therapy: ASA, plavix, and eliquis x 30 days, then stop ASA.   He has several statin intolerances and has seen lipid clinic in the past.  PCSK9 inhibitor was expensive.  We will revisit this now that he is status post PCI.  She was discharged with ciprofloxacin drops for left eye redness concerning for conjunctivitis.   She presents today for post cath follow-up. He presents with his wife and daughter. He brings a list of questions. He is most concerned about not being able to take meloxicam. I have suggested possibly trying gabapentin, but will  defer to PCP for this.   He asks about having a root canal completed. I suggested waiting until triple therapy period is over. He is walking 15-20 min daily. He is going to play gold this weekend. He was aware of his Afib and has been in NSR since discharge. No chest pain, SOB, palpitations, dizziness, lower extremity swelling, or orthopnea.    Past Medical History:  Diagnosis Date   Asbestosis ([place])    Bradycardia 11/26/2015   Coronary artery disease    coronary artery calcifications by chest CT with normal nuclear stress test 2013   GERD (gastroesophageal reflux disease)    Glaucoma    Hyperlipidemia    Hypertension     Past Surgical History:  Procedure Laterality Date   CORONARY LITHOTRIPSY N/A 12/29/2021   Procedure: CORONARY LITHOTRIPSY;  Surgeon: Martinique, Peter M, MD;  Location: [facility];  Service: Cardiovascular;  Laterality: N/A;   CORONARY STENT INTERVENTION N/A 12/29/2021   Procedure: CORONARY STENT INTERVENTION;  Surgeon: Martinique, Peter M, MD;  Location: [facility];  Service: Cardiovascular;  Laterality: N/A;   INTRAVASCULAR PRESSURE WIRE/FFR STUDY N/A 12/29/2021   Procedure: INTRAVASCULAR PRESSURE WIRE/FFR STUDY;  Surgeon: Martinique, Peter M, MD;  Location: [facility];  Service: Cardiovascular;  Laterality: N/A;   LEFT HEART CATH AND CORONARY ANGIOGRAPHY N/A 12/29/2021   Procedure: LEFT HEART CATH AND CORONARY ANGIOGRAPHY;  Surgeon: Martinique, Peter M, MD;  Location: [facility];  Service: Cardiovascular;  Laterality: N/A;    Current Medications: Current Meds  Medication Sig   acetaminophen (TYLENOL) 500 MG tablet Take 500-1,000 mg by mouth every 6 (six) hours as needed for moderate pain.   acetaminophen-codeine (TYLENOL #3) 300-30 MG tablet Take 1 tablet by mouth every 4 (four) hours as needed for moderate pain or severe pain.   amLODipine (NORVASC) 5 MG tablet Take 1 tablet (5 mg total) by mouth daily.   apixaban (ELIQUIS) 5 MG TABS tablet Take  1 tablet (5 mg total) by mouth 2 (two) times daily.   aspirin 81 MG tablet Take 81 mg by mouth daily.   ciprofloxacin (CILOXAN) 0.3 % ophthalmic solution Place 1 drop into the left eye 4 (four) times daily for 5 days.   clopidogrel (PLAVIX) 75 MG tablet Take 1 tablet (75 mg total) by mouth daily.   dorzolamide-timolol (COSOPT) 22.3-6.8 MG/ML ophthalmic solution Place 1 drop into both eyes at bedtime.   lisinopril (ZESTRIL) 20 MG tablet Take 1 tablet (20 mg total) by mouth daily.   nitroGLYCERIN (NITROSTAT) 0.4 MG SL tablet Place 1 tablet (0.4 mg total) under the tongue every 5 (five) minutes x 3 doses as needed for chest pain.   Omega-3 Fatty Acids (FISH OIL) 1000 MG CAPS Take 2,000 mg by mouth daily.   pantoprazole (PROTONIX) 40 MG tablet Take 1 tablet (40 mg total) by mouth daily.   pravastatin (PRAVACHOL) 10 MG tablet Take 10 mg by mouth daily.   tamsulosin (FLOMAX) 0.4 MG CAPS capsule Take 0.4 mg by mouth every morning.     Allergies:   Statins, Trilipix [choline fenofibrate], Welchol [colesevelam hcl], and Zetia [ezetimibe]   Social History   Socioeconomic History   Marital status: Married    Spouse name: Not on file   Number of children: Not on file   Years of education: Not on file   Highest education level: Not on file  Occupational History   Not on file  Tobacco Use   Smoking status: Former    Packs/day: 1.00    Years: 15.00    Total pack years: 15.00    Types: Cigarettes    Quit date: 03/22/1990    Years since quitting: 31.8   Smokeless tobacco: Never  Substance and Sexual Activity   Alcohol use: Yes    Alcohol/week: 0.0 standard drinks of alcohol    Comment: ocassionally   Drug use: No   Sexual activity: Not on file  Other Topics Concern   Not on file  Social History Narrative   Not on file   Social Determinants of Health   Financial Resource Strain: Not on file  Food Insecurity: No Food Insecurity (12/29/2021)   Hunger Vital Sign    Worried About Running  Out of Food in the Last Year: Never true    Ran Out of Food in the Last Year: Never true  Transportation Needs: No Transportation Needs (12/29/2021)   PRAPARE - Hydrologist (Medical): No    Lack of Transportation (Non-Medical): No  Physical Activity: Not on file  Stress: Not on file  Social Connections: Not on file     Family History: The patient's family history includes COPD in his mother and sister; Heart attack in his brother; Heart disease in his brother; Kidney failure in his father.  ROS:   Please see the history of present illness.     All other systems reviewed and are negative.  EKGs/Labs/Other Studies Reviewed:  The following studies were reviewed today:  Cath: 12/29/21     Prox RCA to Mid RCA lesion is 30% stenosed.   RPDA lesion is 50% stenosed.   1st Mrg lesion is 40% stenosed.   Prox Cx to Mid Cx lesion is 70% stenosed.   3rd Mrg lesion is 60% stenosed.   Ost LAD to Mid LAD lesion is 80% stenosed.   1st Diag lesion is 60% stenosed.   A drug-eluting stent was successfully placed using a SYNERGY XD 3.0X24.   Post intervention, there is a 0% residual stenosis.   LV end diastolic pressure is mildly elevated.   Single vessel obstructive CAD. Diffuse CAD. Lesion in the LCx is not hemodynamically significant by FFR Mildly elevated LVEDP 16 mm Hg Successful PCI of the proximal LAD with Shockwave lithotripsy and DES x 1. Preserved flow in the first diagonal   Plan: DAPT with ASA for one month. Plavix for 12 months. Concomitant anticoagulation with Eliquis- may begin tonight. Anticipate DC tomorrow. Treat other CAD medically.    Diagnostic Dominance: Right  Intervention      Echo: 12/28/2021   IMPRESSIONS     1. Left ventricular ejection fraction, by estimation, is 60 to 65%. Left  ventricular ejection fraction by 3D volume is 60 %. The left ventricle has  normal function. The left ventricle has no regional wall motion   abnormalities. Left ventricular diastolic   parameters are indeterminate.   2. Right ventricular systolic function is normal. The right ventricular  size is normal.   3. Left atrial size was moderately dilated.   4. The mitral valve is normal in structure. No evidence of mitral valve  regurgitation. No evidence of mitral stenosis.   5. The aortic valve is normal in structure. Aortic valve regurgitation is  not visualized. No aortic stenosis is present.   6. Aortic dilatation noted. There is mild dilatation of the aortic root,  measuring 41 mm.   7. The inferior vena cava is normal in size with greater than 50%  respiratory variability, suggesting right atrial pressure of 3 mmHg.    EKG:  EKG is  ordered today.  The ekg ordered today demonstrates sinus bradycardia with HR 58  Recent Labs: 12/27/2021: ALT 18; Magnesium 1.7 12/28/2021: B Natriuretic Peptide 92.2; TSH 2.390 12/30/2021: BUN 14; Creatinine, Ser 1.18; Hemoglobin 14.4; Platelets 251; Potassium 3.9; Sodium 139  Recent Lipid Panel    Component Value Date/Time   CHOL 173 12/28/2021 0700   TRIG 133 12/28/2021 0700   HDL 33 (L) 12/28/2021 0700   CHOLHDL 5.2 12/28/2021 0700   VLDL 27 12/28/2021 0700   LDLCALC 113 (H) 12/28/2021 0700     Risk Assessment/Calculations:    CHA2DS2-VASc Score = 3   This indicates a 3.2% annual risk of stroke. The patient's score is based upon: CHF History: 0 HTN History: 1 Diabetes History: 0 Stroke History: 0 Vascular Disease History: 1 Age Score: 1 Gender Score: 0           Physical Exam:    VS:  BP (!) 124/54 (BP Location: Left Arm, Patient Position: Sitting, Cuff Size: Normal)   Pulse 63   Ht '5\' 4"'$  (1.626 m)   BMI 42.05 kg/m     Wt Readings from Last 3 Encounters:  12/29/21 245 lb (111.1 kg)  07/22/16 190 lb (86.2 kg)  12/18/15 197 lb 12 oz (89.7 kg)     GEN:  Well nourished, well developed in no acute distress HEENT: Normal  NECK: No JVD; No carotid  bruits LYMPHATICS: No lymphadenopathy CARDIAC: RRR, no murmurs, rubs, gallops RESPIRATORY:  Clear to auscultation without rales, wheezing or rhonchi  ABDOMEN: Soft, non-tender, non-distended MUSCULOSKELETAL:  No edema; No deformity  SKIN: Warm and dry NEUROLOGIC:  Alert and oriented x 3 PSYCHIATRIC:  Normal affect  Right radial cath site C/D/I  ASSESSMENT:    1. NSTEMI (non-ST elevated myocardial infarction) ([place])   2. Coronary artery calcification seen on CAT scan   3. PAF (paroxysmal atrial fibrillation) ([place])   4. Chronic anticoagulation   5. Hypercholesterolemia   6. Essential hypertension    PLAN:    In order of problems listed above:  CAD NSTEMI DES-LAD 12/29/21 Nonobstructive disease treated medically. Stressed risk factor modification.  Triple therapy: Aspirin, Plavix, Eliquis x30 days, then stop aspirin No beta-blocker at discharge given bradycardia We reviewed that he will stop ASA on 01/30/22.    PAF with RVR Sinus bradycardia EKG today shows sinus rhythm with sinus arrhythmia with PACs   Need for anticoagulation CHA2DS2-VASc of 4 Continue Eliquis, no bleeding issues   Hyperlipidemia with LDL goal < 70 12/28/2021: Cholesterol 173; HDL 33; LDL Cholesterol 113; Triglycerides 133; VLDL 27 LP (a) 18.6 Statin intolerant, PCSK9 previously cost prohibitive We will revisit lipid clinic Pharm.D. now that he is status post PCI     Cardiac Rehabilitation Eligibility Assessment  The patient is ready to start cardiac rehabilitation from a cardiac standpoint.       Follow up in 3 months.   Medication Adjustments/Labs and Tests Ordered: Current medicines are reviewed at length with the patient today.  Concerns regarding medicines are outlined above.  No orders of the defined types were placed in this encounter.  No orders of the defined types were placed in this encounter.   Patient Instructions  Medication Instructions:  Discontinue Asprin on  01-29-2022  *If you need a refill on your cardiac medications before your next appointment, please call your pharmacy*   Lab Work: NONE ordered at this time of appointment   If you have labs (blood work) drawn today and your tests are completely normal, you will receive your results only by: [place] (if you have MyChart) OR A paper copy in the mail If you have any lab test that is abnormal or we need to change your treatment, we will call you to review the results.   Testing/Procedures: NONE ordered at this time of appointment    Follow-Up: At [facility], you and your health needs are our priority.  As part of our continuing mission to provide you with exceptional heart care, we have created designated Provider Care Teams.  These Care Teams include your primary Cardiologist (physician) and Advanced Practice Providers (APPs -  Physician Assistants and Nurse Practitioners) who all work together to provide you with the care you need, when you need it.  We recommend signing up for the patient portal called "MyChart".  Sign up information is provided on this After Visit Summary.  MyChart is used to connect with patients for Virtual Visits (Telemedicine).  Patients are able to view lab/test results, encounter notes, upcoming appointments, etc.  Non-urgent messages can be sent to your provider as well.   To learn more about what you can do with MyChart, go to [email or web address].    Your next appointment:   3 month(s) Next Available (Pharm D) The format for your next appointment:   In Person  Provider:   Peter Martinique, MD  or  Fabian Sharp, PA-C        Other Instructions See your primary care doctor for pain management   Important Information About Sugar         Signed, Ledora Bottcher, [place]  01/06/2022 2:29 PM    [place]

## 2022-01-05 ENCOUNTER — Other Ambulatory Visit (HOSPITAL_COMMUNITY): Payer: Self-pay

## 2022-01-05 ENCOUNTER — Telehealth (HOSPITAL_COMMUNITY): Payer: Self-pay

## 2022-01-05 NOTE — Telephone Encounter (Signed)
Pharmacy Transitions of Care Follow-up Telephone Call  Date of discharge: 12/30/21  Discharge Diagnosis: NSTEMI   How have you been since you were released from the hospital? He has been doing well, other than some complaints of arthritis pain. He was previously on Meloxicam but had to stop this when Plavix and Eliquis were initiated. We discussed scheduling a follow up appointment with his PCP to see about getting started on a different medication that will not interact with his anticoagulant or antiplatelet medications.   Medication changes made at discharge: START taking these medications  START taking these medications  amLODipine 5 MG tablet Commonly known as: NORVASC Take 1 tablet (5 mg total) by mouth daily.  ciprofloxacin 0.3 % ophthalmic solution Commonly known as: CILOXAN Place 1 drop into the left eye 4 (four) times daily for 5 days.  clopidogrel 75 MG tablet Commonly known as: PLAVIX Take 1 tablet (75 mg total) by mouth daily.  Eliquis 5 MG Tabs tablet Generic drug: apixaban Take 1 tablet (5 mg total) by mouth 2 (two) times daily.  lisinopril 20 MG tablet Commonly known as: ZESTRIL Take 1 tablet (20 mg total) by mouth daily.  nitroGLYCERIN 0.4 MG SL tablet Commonly known as: NITROSTAT Place 1 tablet (0.4 mg total) under the tongue every 5 (five) minutes x 3 doses as needed for chest pain.   CONTINUE taking these medications  CONTINUE taking these medications  acetaminophen 500 MG tablet Commonly known as: TYLENOL  acetaminophen-codeine 300-30 MG tablet Commonly known as: TYLENOL #3  aspirin 81 MG tablet  dorzolamide-timolol 2-0.5 % ophthalmic solution Commonly known as: COSOPT  Fish Oil 1000 MG Caps  pravastatin 10 MG tablet Commonly known as: PRAVACHOL  tamsulosin 0.4 MG Caps capsule Commonly known as: FLOMAX   STOP taking these medications  STOP taking these medications  ibuprofen 600 MG tablet Commonly known as: ADVIL  lisinopril-hydrochlorothiazide  20-12.5 MG tablet Commonly known as: ZESTORETIC  meloxicam 15 MG tablet Commonly known as: MOBIC    Medication changes verified by the patient? yes    Medication Accessibility:  [facility]   Was the patient provided with refills on discharged medications? yes   Medication Review: APIXABAN (ELIQUIS)  Apixaban 5 mg BID initiated on 10/11 for AFib  - Discussed importance of taking medication around the same time every day.  - Advised patient of medications to avoid (NSAIDs, ASA maintenance doses>100 mg daily)  - Educated that Tylenol (acetaminophen) will be the preferred analgesic to prevent risk of bleeding. - Emphasized importance of monitoring for signs and symptoms of bleeding (abnormal bruising, prolonged bleeding, nose bleeds, bleeding from gums, discolored urine, black tarry stools)  - Advised patient to alert all providers of anticoagulation therapy prior to starting a new medication or having a procedure.  CLOPIDOGREL (PLAVIX) Clopidogrel 75 mg once daily initiated on 10/11 for NSTEMI - Discussed importance of taking medication around the same time every day.  - Advised patient of medications to avoid (NSAIDs, ASA maintenance doses>100 mg daily)  - Educated that Tylenol (acetaminophen) will be the preferred analgesic to prevent risk of bleeding. - Emphasized importance of monitoring for signs and symptoms of bleeding (abnormal bruising, prolonged bleeding, nose bleeds, bleeding from gums, discolored urine, black tarry stools)  - Advised patient to alert all providers of anticoagulation therapy prior to starting a new medication or having a procedure.  Follow-up Appointments:  Follow up appointment with the cardiology on 10/18  Maryan Puls, PharmD PGY-1 [facility] Resident

## 2022-01-06 ENCOUNTER — Ambulatory Visit: Payer: Medicare Other | Attending: Physician Assistant | Admitting: Physician Assistant

## 2022-01-06 ENCOUNTER — Encounter: Payer: Self-pay | Admitting: Physician Assistant

## 2022-01-06 VITALS — BP 124/54 | HR 63 | Ht 64.0 in

## 2022-01-06 DIAGNOSIS — I10 Essential (primary) hypertension: Secondary | ICD-10-CM

## 2022-01-06 DIAGNOSIS — Z7901 Long term (current) use of anticoagulants: Secondary | ICD-10-CM

## 2022-01-06 DIAGNOSIS — E7800 Pure hypercholesterolemia, unspecified: Secondary | ICD-10-CM

## 2022-01-06 DIAGNOSIS — I214 Non-ST elevation (NSTEMI) myocardial infarction: Secondary | ICD-10-CM | POA: Diagnosis not present

## 2022-01-06 DIAGNOSIS — I480 Paroxysmal atrial fibrillation: Secondary | ICD-10-CM

## 2022-01-06 DIAGNOSIS — I2510 Atherosclerotic heart disease of native coronary artery without angina pectoris: Secondary | ICD-10-CM

## 2022-01-06 NOTE — Patient Instructions (Addendum)
Medication Instructions:  Discontinue Asprin on 01-29-2022  *If you need a refill on your cardiac medications before your next appointment, please call your pharmacy*   Lab Work: NONE ordered at this time of appointment   If you have labs (blood work) drawn today and your tests are completely normal, you will receive your results only by: [place] (if you have MyChart) OR A paper copy in the mail If you have any lab test that is abnormal or we need to change your treatment, we will call you to review the results.   Testing/Procedures: NONE ordered at this time of appointment    Follow-Up: At [facility], you and your health needs are our priority.  As part of our continuing mission to provide you with exceptional heart care, we have created designated Provider Care Teams.  These Care Teams include your primary Cardiologist (physician) and Advanced Practice Providers (APPs -  Physician Assistants and Nurse Practitioners) who all work together to provide you with the care you need, when you need it.  We recommend signing up for the patient portal called "MyChart".  Sign up information is provided on this After Visit Summary.  MyChart is used to connect with patients for Virtual Visits (Telemedicine).  Patients are able to view lab/test results, encounter notes, upcoming appointments, etc.  Non-urgent messages can be sent to your provider as well.   To learn more about what you can do with MyChart, go to [email or web address].    Your next appointment:   3 month(s) Next Available (Pharm D) The format for your next appointment:   In Person  Provider:   Peter Martinique, MD  or Fabian Sharp, PA-C        Other Instructions See your primary care doctor for pain management   Important Information About Sugar

## 2022-01-08 DIAGNOSIS — H16202 Unspecified keratoconjunctivitis, left eye: Secondary | ICD-10-CM | POA: Diagnosis not present

## 2022-01-11 ENCOUNTER — Encounter: Payer: Self-pay | Admitting: Pharmacist

## 2022-01-11 ENCOUNTER — Ambulatory Visit: Payer: Medicare Other | Attending: Cardiology | Admitting: Pharmacist

## 2022-01-11 DIAGNOSIS — I2510 Atherosclerotic heart disease of native coronary artery without angina pectoris: Secondary | ICD-10-CM | POA: Insufficient documentation

## 2022-01-11 DIAGNOSIS — I214 Non-ST elevation (NSTEMI) myocardial infarction: Secondary | ICD-10-CM | POA: Diagnosis not present

## 2022-01-11 NOTE — Progress Notes (Signed)
Patient ID: MARRIO SCRIBNER                 DOB: 28-Oct-1945                    MRN: [id number]      HPI: Caleb Anderson is a 76 y.o. male patient referred to lipid clinic by Doreene Adas. PMH is significant for A Fib, HTN, NSTEM, CAD, and statin myopathy.   Patient reported feeling off on 10/8. [place] [facility] which showed he was in A fib.  Transported to ED where it was also discovered he had NSTEMI. Is intolerant to high intensity statins. Currentl managed on pravastatin. Also on triple therapy for a fib and CAD.  Patient presents today to discuss cholesterol management. His chief concern is muscle and joint pain which is stemming from recommendation to not take meloxicam. Advised he should follow up with PCP regaridng pain management.  Current Medications:  Pravastatin '10mg'$  daily  Intolerances:  Rosuvastatin Atorvastatin Simvastatin Zetia  Risk Factors:  CAD NSTEMI Coronary calcium  LDL goal: <70  Labs: TC 173, Trigs 133, HDL 33, LDL 113 (12/30/20 on pravastatin '10mg'$ )  Past Medical History:  Diagnosis Date   Asbestosis ([place])    Bradycardia 11/26/2015   Coronary artery disease    coronary artery calcifications by chest CT with normal nuclear stress test 2013   GERD (gastroesophageal reflux disease)    Glaucoma    Hyperlipidemia    Hypertension     Current Outpatient Medications on File Prior to Visit  Medication Sig Dispense Refill   acetaminophen (TYLENOL) 500 MG tablet Take 500-1,000 mg by mouth every 6 (six) hours as needed for moderate pain.     acetaminophen-codeine (TYLENOL #3) 300-30 MG tablet Take 1 tablet by mouth every 4 (four) hours as needed for moderate pain or severe pain.     amLODipine (NORVASC) 5 MG tablet Take 1 tablet (5 mg total) by mouth daily. 30 tablet 11   apixaban (ELIQUIS) 5 MG TABS tablet Take 1 tablet (5 mg total) by mouth 2 (two) times daily. 180 tablet 1   aspirin 81 MG tablet Take 81 mg by mouth daily.     ciprofloxacin  (CILOXAN) 0.3 % ophthalmic solution Place 1 drop into the left eye 4 (four) times daily for 5 days. 5 mL 0   clopidogrel (PLAVIX) 75 MG tablet Take 1 tablet (75 mg total) by mouth daily. 90 tablet 2   dorzolamide-timolol (COSOPT) 22.3-6.8 MG/ML ophthalmic solution Place 1 drop into both eyes at bedtime.     lisinopril (ZESTRIL) 20 MG tablet Take 1 tablet (20 mg total) by mouth daily. 90 tablet 1   nitroGLYCERIN (NITROSTAT) 0.4 MG SL tablet Place 1 tablet (0.4 mg total) under the tongue every 5 (five) minutes x 3 doses as needed for chest pain. 25 tablet 2   Omega-3 Fatty Acids (FISH OIL) 1000 MG CAPS Take 2,000 mg by mouth daily.     pantoprazole (PROTONIX) 40 MG tablet Take 1 tablet (40 mg total) by mouth daily. 30 tablet 11   pravastatin (PRAVACHOL) 10 MG tablet Take 10 mg by mouth daily.     tamsulosin (FLOMAX) 0.4 MG CAPS capsule Take 0.4 mg by mouth every morning.     No current facility-administered medications on file prior to visit.    Allergies  Allergen Reactions   Statins Other (See Comments)    Joint pain and fatigue(Lipitor, crestor, simvastatin, and pravastatin)  Trilipix [Choline Fenofibrate] Other (See Comments)    Joint pain/fatigue   Welchol [Colesevelam Hcl]     Aches    Zetia [Ezetimibe] Other (See Comments)    Joint pain/fatigue    Assessment/Plan:  1. Hyperlipidemia - Patient recent LDL 113 while hospitalized which is above goal of <70. Unfortunately is intolerant to higher intensity statins than pravastatin '10mg'$ . Discussed next lipid lowering options such as Repatha vs Nexletol. Since patient had recent NSTEMI, recommended Repatha,  Using demo pen, educated patient on mechanism of action, storage, site selection, administration, and possible adverse effects.  Patient voiced understanding and was able to demonstrate in room.  Will complete PA and contact patient when approved. Recheck lipid panel in 2-3 months.  Continue pravastatin '10mg'$  daily Start Repatha  '140mg'$  q 2 weeks Recheck lipid panel in 2-3 months  Karren Cobble, PharmD, [place], [place], [place], [place], [place], [address] Phone: [phone], Fax: [phone]

## 2022-01-11 NOTE — Patient Instructions (Addendum)
It was nice meeting you today  We would like your LDL (bad cholesterol) to be less than 70  Continue your pravastatin '10mg'$  once a day  We would like to start you on a new medication called Repatha which you would inject once every 2 weeks  I will complete the prior authorization for you and contact you when it is approved  Once you start the medication we will recheck your cholesterol in about 2-3 months  Please call with any questions  Karren Cobble, PharmD, [place], [place], [place], [place], [place], [address] Phone: [phone], Fax: [phone]

## 2022-01-12 ENCOUNTER — Telehealth: Payer: Self-pay | Admitting: Pharmacist

## 2022-01-12 DIAGNOSIS — I2510 Atherosclerotic heart disease of native coronary artery without angina pectoris: Secondary | ICD-10-CM

## 2022-01-12 DIAGNOSIS — E7800 Pure hypercholesterolemia, unspecified: Secondary | ICD-10-CM

## 2022-01-12 DIAGNOSIS — I214 Non-ST elevation (NSTEMI) myocardial infarction: Secondary | ICD-10-CM

## 2022-01-12 MED ORDER — REPATHA SURECLICK 140 MG/ML ~~LOC~~ SOAJ: 1.0000 mL | SUBCUTANEOUS | 11 refills | Status: DC

## 2022-01-12 NOTE — Telephone Encounter (Addendum)
Repatha approved through 03/22/23. Called patient, he is aware. Recheck lipid panel in 2-3 months.

## 2022-01-12 NOTE — Addendum Note (Signed)
Addended by: Rollen Sox on: 01/12/2022 09:54 AM   Modules accepted: Orders

## 2022-01-12 NOTE — Telephone Encounter (Signed)
PA for Repatha submitted. Key: [id number]

## 2022-01-18 DIAGNOSIS — I4891 Unspecified atrial fibrillation: Secondary | ICD-10-CM | POA: Diagnosis not present

## 2022-01-18 DIAGNOSIS — D6869 Other thrombophilia: Secondary | ICD-10-CM | POA: Diagnosis not present

## 2022-01-18 DIAGNOSIS — I119 Hypertensive heart disease without heart failure: Secondary | ICD-10-CM | POA: Diagnosis not present

## 2022-01-18 DIAGNOSIS — E782 Mixed hyperlipidemia: Secondary | ICD-10-CM | POA: Diagnosis not present

## 2022-01-18 DIAGNOSIS — I25119 Atherosclerotic heart disease of native coronary artery with unspecified angina pectoris: Secondary | ICD-10-CM | POA: Diagnosis not present

## 2022-01-18 DIAGNOSIS — M1990 Unspecified osteoarthritis, unspecified site: Secondary | ICD-10-CM | POA: Diagnosis not present

## 2022-01-27 ENCOUNTER — Other Ambulatory Visit (HOSPITAL_COMMUNITY): Payer: Self-pay

## 2022-03-05 DIAGNOSIS — H16202 Unspecified keratoconjunctivitis, left eye: Secondary | ICD-10-CM | POA: Diagnosis not present

## 2022-03-29 ENCOUNTER — Other Ambulatory Visit (HOSPITAL_COMMUNITY): Payer: Self-pay

## 2022-04-06 NOTE — Progress Notes (Signed)
Cardiology Office Note:    Date:  04/14/2022   ID:  Caleb Anderson, DOB Feb 04, 1946, MRN [id number]  PCP:  Mayra Neer, MD   [facility] Providers Cardiologist:  Jhordan Mckibben Martinique, MD Cardiology APP:  Ledora Bottcher, [place] { Referring MD: Mayra Neer, MD   Chief Complaint  Patient presents with   Coronary Artery Disease  NSTEMI  History of Present Illness:    Caleb Anderson is a 77 y.o. male with a hx of CAD, hypertension, hyperlipidemia, BPH, anemia, glaucoma, and A-fib.  He has known coronary artery calcifications on CT and has been intolerant to multiple statins. Previously seen by Dr. Radford Pax.  Nuclear stress test nonischemic in 2013.  He was hospitalized 10/8 - 12/30/21 with NSTEMI found to have new A-fib RVR.  RVR rates were in the 1 50-200 range.  She spontaneously converted to sinus bradycardia. HS troponin trended to 146 and she underwent heart catheterization 12/29/2021 found to have single-vessel obstructive CAD in the proximal LAD treated with lithotripsy and DES times one 3.0 x 24 mm.  CAD in the LCx was not hemodynamically significant by FFR.  LVEDP was 16 mmHg.  Echocardiogram revealed LVEF 65 to 65%, normal RV, no significant valvular disease.  She does have an aortic root dilation of 41 mm. She was treated with triple therapy: ASA, plavix, and eliquis x 30 days, then stop ASA.   He has several statin intolerances and has seen lipid clinic in the past.  PCSK9 inhibitor was expensive.  He has been seen by lipid clinic again. Repatha approved through this year. He reports he is tolerating this well but may want to consider Nexletol. He does like walking outdoors and doing yard work. This has been limited recently by weather. He reports BP usually < 035-009 systolic at home.    Past Medical History:  Diagnosis Date   Asbestosis ([place])    Bradycardia 11/26/2015   Coronary artery disease    coronary artery calcifications by chest CT with normal nuclear stress  test 2013   GERD (gastroesophageal reflux disease)    Glaucoma    Hyperlipidemia    Hypertension     Past Surgical History:  Procedure Laterality Date   CORONARY LITHOTRIPSY N/A 12/29/2021   Procedure: CORONARY LITHOTRIPSY;  Surgeon: Martinique, Mali Eppard M, MD;  Location: [facility];  Service: Cardiovascular;  Laterality: N/A;   CORONARY STENT INTERVENTION N/A 12/29/2021   Procedure: CORONARY STENT INTERVENTION;  Surgeon: Martinique, Josip Merolla M, MD;  Location: [facility];  Service: Cardiovascular;  Laterality: N/A;   INTRAVASCULAR PRESSURE WIRE/FFR STUDY N/A 12/29/2021   Procedure: INTRAVASCULAR PRESSURE WIRE/FFR STUDY;  Surgeon: Martinique, Kieryn Burtis M, MD;  Location: [facility];  Service: Cardiovascular;  Laterality: N/A;   LEFT HEART CATH AND CORONARY ANGIOGRAPHY N/A 12/29/2021   Procedure: LEFT HEART CATH AND CORONARY ANGIOGRAPHY;  Surgeon: Martinique, Karmen Altamirano M, MD;  Location: [facility];  Service: Cardiovascular;  Laterality: N/A;    Current Medications: Current Meds  Medication Sig   acetaminophen (TYLENOL) 500 MG tablet Take 500-1,000 mg by mouth every 6 (six) hours as needed for moderate pain.   acetaminophen-codeine (TYLENOL #3) 300-30 MG tablet Take 1 tablet by mouth every 4 (four) hours as needed for moderate pain or severe pain.   amLODipine (NORVASC) 5 MG tablet Take 1 tablet (5 mg total) by mouth daily.   apixaban (ELIQUIS) 5 MG TABS tablet Take 1 tablet (5 mg total) by mouth 2 (two)  times daily.   clopidogrel (PLAVIX) 75 MG tablet Take 1 tablet (75 mg total) by mouth daily.   dorzolamide-timolol (COSOPT) 22.3-6.8 MG/ML ophthalmic solution Place 1 drop into both eyes at bedtime.   Evolocumab (REPATHA SURECLICK) 025 MG/ML SOAJ Inject 140 mg into the skin every 14 (fourteen) days.   lisinopril (ZESTRIL) 20 MG tablet Take 1 tablet (20 mg total) by mouth daily.   nitroGLYCERIN (NITROSTAT) 0.4 MG SL tablet Place 1 tablet (0.4 mg total) under the tongue every 5 (five)  minutes x 3 doses as needed for chest pain.   Omega-3 Fatty Acids (FISH OIL) 1000 MG CAPS Take 2,000 mg by mouth daily.   pantoprazole (PROTONIX) 40 MG tablet Take 1 tablet (40 mg total) by mouth daily.   pravastatin (PRAVACHOL) 10 MG tablet Take 10 mg by mouth daily.   tamsulosin (FLOMAX) 0.4 MG CAPS capsule Take 0.4 mg by mouth daily as needed (Patient states he does not need it).     Allergies:   Statins, Trilipix [choline fenofibrate], Welchol [colesevelam hcl], and Zetia [ezetimibe]   Social History   Socioeconomic History   Marital status: Married    Spouse name: Not on file   Number of children: Not on file   Years of education: Not on file   Highest education level: Not on file  Occupational History   Not on file  Tobacco Use   Smoking status: Former    Packs/day: 1.00    Years: 15.00    Total pack years: 15.00    Types: Cigarettes    Quit date: 03/22/1990    Years since quitting: 32.0   Smokeless tobacco: Never  Substance and Sexual Activity   Alcohol use: Yes    Alcohol/week: 0.0 standard drinks of alcohol    Comment: ocassionally   Drug use: No   Sexual activity: Not on file  Other Topics Concern   Not on file  Social History Narrative   Not on file   Social Determinants of Health   Financial Resource Strain: Not on file  Food Insecurity: No Food Insecurity (12/29/2021)   Hunger Vital Sign    Worried About Running Out of Food in the Last Year: Never true    Ran Out of Food in the Last Year: Never true  Transportation Needs: No Transportation Needs (12/29/2021)   PRAPARE - Hydrologist (Medical): No    Lack of Transportation (Non-Medical): No  Physical Activity: Not on file  Stress: Not on file  Social Connections: Not on file     Family History: The patient's family history includes COPD in his mother and sister; Heart attack in his brother; Heart disease in his brother; Kidney failure in his father.  ROS:   Please see  the history of present illness.     All other systems reviewed and are negative.  EKGs/Labs/Other Studies Reviewed:    The following studies were reviewed today:  Cath: 12/29/21     Prox RCA to Mid RCA lesion is 30% stenosed.   RPDA lesion is 50% stenosed.   1st Mrg lesion is 40% stenosed.   Prox Cx to Mid Cx lesion is 70% stenosed.   3rd Mrg lesion is 60% stenosed.   Ost LAD to Mid LAD lesion is 80% stenosed.   1st Diag lesion is 60% stenosed.   A drug-eluting stent was successfully placed using a SYNERGY XD 3.0X24.   Post intervention, there is a 0% residual stenosis.   LV  end diastolic pressure is mildly elevated.   Single vessel obstructive CAD. Diffuse CAD. Lesion in the LCx is not hemodynamically significant by FFR Mildly elevated LVEDP 16 mm Hg Successful PCI of the proximal LAD with Shockwave lithotripsy and DES x 1. Preserved flow in the first diagonal   Plan: DAPT with ASA for one month. Plavix for 12 months. Concomitant anticoagulation with Eliquis- may begin tonight. Anticipate DC tomorrow. Treat other CAD medically.    Diagnostic Dominance: Right  Intervention      Echo: 12/28/2021   IMPRESSIONS     1. Left ventricular ejection fraction, by estimation, is 60 to 65%. Left  ventricular ejection fraction by 3D volume is 60 %. The left ventricle has  normal function. The left ventricle has no regional wall motion  abnormalities. Left ventricular diastolic   parameters are indeterminate.   2. Right ventricular systolic function is normal. The right ventricular  size is normal.   3. Left atrial size was moderately dilated.   4. The mitral valve is normal in structure. No evidence of mitral valve  regurgitation. No evidence of mitral stenosis.   5. The aortic valve is normal in structure. Aortic valve regurgitation is  not visualized. No aortic stenosis is present.   6. Aortic dilatation noted. There is mild dilatation of the aortic root,  measuring 41 mm.    7. The inferior vena cava is normal in size with greater than 50%  respiratory variability, suggesting right atrial pressure of 3 mmHg.    EKG:  EKG is  not ordered today.    Recent Labs: 12/27/2021: ALT 18; Magnesium 1.7 12/28/2021: B Natriuretic Peptide 92.2; TSH 2.390 12/30/2021: BUN 14; Creatinine, Ser 1.18; Hemoglobin 14.4; Platelets 251; Potassium 3.9; Sodium 139  Recent Lipid Panel    Component Value Date/Time   CHOL 173 12/28/2021 0700   TRIG 133 12/28/2021 0700   HDL 33 (L) 12/28/2021 0700   CHOLHDL 5.2 12/28/2021 0700   VLDL 27 12/28/2021 0700   LDLCALC 113 (H) 12/28/2021 0700     Risk Assessment/Calculations:    CHA2DS2-VASc Score = 3   This indicates a 3.2% annual risk of stroke. The patient's score is based upon: CHF History: 0 HTN History: 1 Diabetes History: 0 Stroke History: 0 Vascular Disease History: 1 Age Score: 1 Gender Score: 0          Physical Exam:    VS:  BP (!) 164/76 (BP Location: Right Arm, Cuff Size: Large)   Pulse 60   Ht '5\' 6"'$  (1.676 m)   Wt 187 lb (84.8 kg)   SpO2 97%   BMI 30.18 kg/m     Wt Readings from Last 3 Encounters:  04/14/22 187 lb (84.8 kg)  12/29/21 245 lb (111.1 kg)  07/22/16 190 lb (86.2 kg)     GEN:  Well nourished, well developed in no acute distress HEENT: Normal NECK: No JVD; No carotid bruits LYMPHATICS: No lymphadenopathy CARDIAC: RRR, no murmurs, rubs, gallops RESPIRATORY:  Clear to auscultation without rales, wheezing or rhonchi  ABDOMEN: Soft, non-tender, non-distended MUSCULOSKELETAL:  No edema; No deformity  SKIN: Warm and dry NEUROLOGIC:  Alert and oriented x 3 PSYCHIATRIC:  Normal affect  Right radial cath site C/D/I  ASSESSMENT:    1. Hypercholesterolemia   2. Coronary artery disease of native artery of native heart with stable angina pectoris ([place])   3. PAF (paroxysmal atrial fibrillation) ([place])   4. Chronic anticoagulation   5. Essential hypertension  PLAN:    In order of problems  listed above:  CAD NSTEMI DES-LAD 12/29/21 Nonobstructive disease in LCx treated medically. FFR OK. Stressed risk factor modification.  Plavix for one year, on Eliquis for Afib No beta-blocker at discharge given bradycardia He has no significant angina On amlodipine  PAF with RVR Sinus bradycardia Maintaining NSR Continue Eliquis  Need for anticoagulation CHA2DS2-VASc of 4 Continue Eliquis, no bleeding issues  Hyperlipidemia with LDL goal < 70 12/28/2021: Cholesterol 173; HDL 33; LDL Cholesterol 113; Triglycerides 133; VLDL 27 LP (a) 18.6 Statin intolerant- on low dose Pravachol, Now on  Repatha.  Will check labs today. If he does decide to switch to Nexletol will need to repeat lab after 3 months and compare  HTN BP high today but patient reports good readings at home and states he was rushing today. Will monitor at home and if staying over 825 systolic will need additional therapy       Follow up in 6 months.   Medication Adjustments/Labs and Tests Ordered: Current medicines are reviewed at length with the patient today.  Concerns regarding medicines are outlined above.  Orders Placed This Encounter  Procedures   Comprehensive metabolic panel   Lipid panel   No orders of the defined types were placed in this encounter.   Patient Instructions  Medication Instructions:  No Changes In Medications at this time.  *If you need a refill on your cardiac medications before your next appointment, please call your pharmacy*  Lab Work: BLOOD WORK TODAY  If you have labs (blood work) drawn today and your tests are completely normal, you will receive your results only by: [facility] (if you have MyChart) OR A paper copy in the mail If you have any lab test that is abnormal or we need to change your treatment, we will call you to review the results.  Testing/Procedures: None Ordered At This Time.   Follow-Up: At [facility], you and your health needs are  our priority.  As part of our continuing mission to provide you with exceptional heart care, we have created designated Provider Care Teams.  These Care Teams include your primary Cardiologist (physician) and Advanced Practice Providers (APPs -  Physician Assistants and Nurse Practitioners) who all work together to provide you with the care you need, when you need it.  Your next appointment:   6 month(s)  Provider:   Elsworth Ledin Martinique, MD    Other Instructions PLEASE CHECK YOUR BLOOD PRESSURE DAILY AND WRITE THIS NUMBER DOWN. IF YOUR BLOOD PRESSURE IS CONSISTENTLY HIGHER THAN 053 SYSTOLIC THEN PLEASE CONTACT OUR OFFICE AND LET us KNOW. THE NUMBER TO THE OFFICE IS [phone].     Signed, Clancy Leiner Martinique, MD  04/14/2022 8:15 AM    [place]

## 2022-04-14 ENCOUNTER — Ambulatory Visit: Payer: Medicare Other | Attending: Cardiology | Admitting: Cardiology

## 2022-04-14 ENCOUNTER — Encounter: Payer: Self-pay | Admitting: Cardiology

## 2022-04-14 VITALS — BP 164/76 | HR 60 | Ht 66.0 in | Wt 187.0 lb

## 2022-04-14 DIAGNOSIS — E7800 Pure hypercholesterolemia, unspecified: Secondary | ICD-10-CM

## 2022-04-14 DIAGNOSIS — I480 Paroxysmal atrial fibrillation: Secondary | ICD-10-CM | POA: Insufficient documentation

## 2022-04-14 DIAGNOSIS — Z7901 Long term (current) use of anticoagulants: Secondary | ICD-10-CM

## 2022-04-14 DIAGNOSIS — I25118 Atherosclerotic heart disease of native coronary artery with other forms of angina pectoris: Secondary | ICD-10-CM | POA: Insufficient documentation

## 2022-04-14 DIAGNOSIS — I10 Essential (primary) hypertension: Secondary | ICD-10-CM | POA: Insufficient documentation

## 2022-04-14 NOTE — Patient Instructions (Signed)
Medication Instructions:  No Changes In Medications at this time.  *If you need a refill on your cardiac medications before your next appointment, please call your pharmacy*  Lab Work: BLOOD WORK TODAY  If you have labs (blood work) drawn today and your tests are completely normal, you will receive your results only by: [facility] (if you have MyChart) OR A paper copy in the mail If you have any lab test that is abnormal or we need to change your treatment, we will call you to review the results.  Testing/Procedures: None Ordered At This Time.   Follow-Up: At [facility], you and your health needs are our priority.  As part of our continuing mission to provide you with exceptional heart care, we have created designated Provider Care Teams.  These Care Teams include your primary Cardiologist (physician) and Advanced Practice Providers (APPs -  Physician Assistants and Nurse Practitioners) who all work together to provide you with the care you need, when you need it.  Your next appointment:   6 month(s)  Provider:   Peter Martinique, MD    Other Instructions PLEASE CHECK YOUR BLOOD PRESSURE DAILY AND WRITE THIS NUMBER DOWN. IF YOUR BLOOD PRESSURE IS CONSISTENTLY HIGHER THAN 263 SYSTOLIC THEN PLEASE CONTACT OUR OFFICE AND LET us KNOW. THE NUMBER TO THE OFFICE IS [phone].

## 2022-04-15 LAB — LIPID PANEL
Chol/HDL Ratio: 2.1 ratio (ref 0.0–5.0)
Cholesterol, Total: 110 mg/dL (ref 100–199)
HDL: 52 mg/dL (ref >39)
LDL Chol Calc (NIH): 32 mg/dL (ref 0–99)
Triglycerides: 153 mg/dL — ABNORMAL HIGH (ref 0–149)
VLDL Cholesterol Cal: 26 mg/dL (ref 5–40)

## 2022-04-15 LAB — COMPREHENSIVE METABOLIC PANEL
ALT: 13 IU/L (ref 0–44)
AST: 14 IU/L (ref 0–40)
Albumin/Globulin Ratio: 1.9 (ref 1.2–2.2)
Albumin: 4.2 g/dL (ref 3.8–4.8)
Alkaline Phosphatase: 94 IU/L (ref 44–121)
BUN/Creatinine Ratio: 13 (ref 10–24)
BUN: 15 mg/dL (ref 8–27)
Bilirubin Total: 0.7 mg/dL (ref 0.0–1.2)
CO2: 25 mmol/L (ref 20–29)
Calcium: 9.1 mg/dL (ref 8.6–10.2)
Chloride: 104 mmol/L (ref 96–106)
Creatinine, Ser: 1.16 mg/dL (ref 0.76–1.27)
Globulin, Total: 2.2 g/dL (ref 1.5–4.5)
Glucose: 102 mg/dL — ABNORMAL HIGH (ref 70–99)
Potassium: 4.8 mmol/L (ref 3.5–5.2)
Sodium: 139 mmol/L (ref 134–144)
Total Protein: 6.4 g/dL (ref 6.0–8.5)
eGFR: 66 mL/min/{1.73_m2} (ref >59)

## 2022-04-15 LAB — CBC
Hematocrit: 45.2 % (ref 37.5–51.0)
Hemoglobin: 14.4 g/dL (ref 13.0–17.7)
MCH: 27.4 pg (ref 26.6–33.0)
MCHC: 31.9 g/dL (ref 31.5–35.7)
MCV: 86 fL (ref 79–97)
Platelets: 258 10*3/uL (ref 150–450)
RBC: 5.25 x10E6/uL (ref 4.14–5.80)
RDW: 12.7 % (ref 11.6–15.4)
WBC: 7.9 10*3/uL (ref 3.4–10.8)

## 2022-04-21 DIAGNOSIS — E669 Obesity, unspecified: Secondary | ICD-10-CM | POA: Diagnosis not present

## 2022-04-21 DIAGNOSIS — M1990 Unspecified osteoarthritis, unspecified site: Secondary | ICD-10-CM | POA: Diagnosis not present

## 2022-04-21 DIAGNOSIS — D6869 Other thrombophilia: Secondary | ICD-10-CM | POA: Diagnosis not present

## 2022-04-21 DIAGNOSIS — I4891 Unspecified atrial fibrillation: Secondary | ICD-10-CM | POA: Diagnosis not present

## 2022-04-21 DIAGNOSIS — Z0000 Encounter for general adult medical examination without abnormal findings: Secondary | ICD-10-CM | POA: Diagnosis not present

## 2022-04-21 DIAGNOSIS — I119 Hypertensive heart disease without heart failure: Secondary | ICD-10-CM | POA: Diagnosis not present

## 2022-04-21 DIAGNOSIS — I25119 Atherosclerotic heart disease of native coronary artery with unspecified angina pectoris: Secondary | ICD-10-CM | POA: Diagnosis not present

## 2022-04-21 DIAGNOSIS — R7303 Prediabetes: Secondary | ICD-10-CM | POA: Diagnosis not present

## 2022-04-21 DIAGNOSIS — N400 Enlarged prostate without lower urinary tract symptoms: Secondary | ICD-10-CM | POA: Diagnosis not present

## 2022-04-21 DIAGNOSIS — D692 Other nonthrombocytopenic purpura: Secondary | ICD-10-CM | POA: Diagnosis not present

## 2022-04-21 DIAGNOSIS — E782 Mixed hyperlipidemia: Secondary | ICD-10-CM | POA: Diagnosis not present

## 2022-04-21 DIAGNOSIS — H409 Unspecified glaucoma: Secondary | ICD-10-CM | POA: Diagnosis not present

## 2022-04-21 DIAGNOSIS — R972 Elevated prostate specific antigen [PSA]: Secondary | ICD-10-CM | POA: Diagnosis not present

## 2022-04-21 DIAGNOSIS — Z125 Encounter for screening for malignant neoplasm of prostate: Secondary | ICD-10-CM | POA: Diagnosis not present

## 2022-06-24 ENCOUNTER — Other Ambulatory Visit: Payer: Self-pay

## 2022-06-24 ENCOUNTER — Telehealth: Payer: Self-pay | Admitting: Cardiology

## 2022-06-24 MED ORDER — AMLODIPINE BESYLATE 5 MG PO TABS: 5.0000 mg | ORAL_TABLET | Freq: Every day | ORAL | 0 refills | Status: DC

## 2022-06-24 MED ORDER — CLOPIDOGREL BISULFATE 75 MG PO TABS: 75.0000 mg | ORAL_TABLET | Freq: Every day | ORAL | 2 refills | Status: DC

## 2022-06-24 MED ORDER — APIXABAN 5 MG PO TABS: 5.0000 mg | ORAL_TABLET | Freq: Two times a day (BID) | ORAL | 1 refills | Status: DC

## 2022-06-24 NOTE — Telephone Encounter (Signed)
*  STAT* If patient is at the pharmacy, call can be transferred to refill team.   1. Which medications need to be refilled? (please list name of each medication and dose if known)  amLODipine (NORVASC) 5 MG tablet Take 1 tablet (5 mg total) by mouth daily.  apixaban (ELIQUIS) 5 MG TABS tablet Take 1 tablet (5 mg total) by mouth 2 (two) times daily.  clopidogrel (PLAVIX) 75 MG tablet   Take 1 tablet (75 mg total) by mouth daily.   2. Which pharmacy/location (including street and city if local pharmacy) is medication to be sent to? [facility] [address] [place], [place] [address]  3. Do they need a 30 day or 90 day supply? [place] heart medication at home. The pt only need enough to last till Sunday. Pt would like a call back once the refill has been sent

## 2022-06-26 ENCOUNTER — Other Ambulatory Visit: Payer: Self-pay | Admitting: Cardiology

## 2022-07-01 ENCOUNTER — Telehealth: Payer: Self-pay | Admitting: Cardiology

## 2022-07-01 NOTE — Telephone Encounter (Signed)
Patient aware of recommendations and verbalized understanding

## 2022-07-01 NOTE — Telephone Encounter (Signed)
Patient was returning call. Please advise ?

## 2022-07-01 NOTE — Telephone Encounter (Signed)
  Most likely related to statin. I would hold the pravastatin but continue Repatha. Let us know if symptoms do not resolve.  Peter Swaziland MD, [facility]    Attempted to call patient, left message for patient to call back to office. '

## 2022-07-01 NOTE — Telephone Encounter (Signed)
Patient states body aches, muscle in arms after working on something or exercise is worse. Likes muscles  and tendons in arms and legs hurt.  Fatigue as well  Squeeze my hands and joints hurt. "Just can't stand it anymore"  Going on for 5-6 months. He has been on pravastatin and Repatha shortly after.  He has not taken either today but Repatha is due today.  Please advise.

## 2022-07-01 NOTE — Telephone Encounter (Signed)
Pt would like a callback regarding his body aching and he's not sure what's causing it but he's assuming its once of his many medications. Please advise.

## 2022-08-17 ENCOUNTER — Other Ambulatory Visit: Payer: Self-pay

## 2022-08-17 MED ORDER — NITROGLYCERIN 0.4 MG SL SUBL: 0.4000 mg | SUBLINGUAL_TABLET | SUBLINGUAL | 7 refills | Status: AC | PRN

## 2022-08-20 ENCOUNTER — Telehealth: Payer: Self-pay | Admitting: Cardiology

## 2022-08-20 NOTE — Telephone Encounter (Signed)
Pt c/o medication issue:  1. Name of Medication:  Evolocumab (REPATHA SURECLICK) 140 MG/ML SOAJ   2. How are you currently taking this medication (dosage and times per day)? As prescribed   3. Are you having a reaction (difficulty breathing--STAT)? No   4. What is your medication issue? Is wanting to switch over the pill discussed is an alternative for this medication due to the cost.   He reports he is due for a prescription, but they were asking for too much when he went to pick it up.

## 2022-08-20 NOTE — Telephone Encounter (Signed)
Left voicemail for patient to return call to office. 

## 2022-08-20 NOTE — Telephone Encounter (Signed)
Patient states repatha is too expensive and he can not afford the medication. It went form $147 to over $400. He would like to discuss other options.

## 2022-08-20 NOTE — Telephone Encounter (Signed)
Patient is returning call.  °

## 2022-08-24 ENCOUNTER — Encounter: Payer: Self-pay | Admitting: Pharmacist

## 2022-08-24 NOTE — Telephone Encounter (Signed)
Will enroll pt in [facility] grant. Info sent to pt via MyChart message, also spoke with him over the phone.  CARD NO. [id number]   BIN [id number]   PCN [id number]   GROUP [id number]

## 2022-10-12 DIAGNOSIS — E782 Mixed hyperlipidemia: Secondary | ICD-10-CM | POA: Diagnosis not present

## 2022-10-12 DIAGNOSIS — G720 Drug-induced myopathy: Secondary | ICD-10-CM | POA: Diagnosis not present

## 2022-10-12 DIAGNOSIS — R7303 Prediabetes: Secondary | ICD-10-CM | POA: Diagnosis not present

## 2022-10-12 DIAGNOSIS — I119 Hypertensive heart disease without heart failure: Secondary | ICD-10-CM | POA: Diagnosis not present

## 2022-10-12 DIAGNOSIS — I4891 Unspecified atrial fibrillation: Secondary | ICD-10-CM | POA: Diagnosis not present

## 2022-10-12 DIAGNOSIS — D6869 Other thrombophilia: Secondary | ICD-10-CM | POA: Diagnosis not present

## 2022-10-12 DIAGNOSIS — I25119 Atherosclerotic heart disease of native coronary artery with unspecified angina pectoris: Secondary | ICD-10-CM | POA: Diagnosis not present

## 2022-10-12 DIAGNOSIS — M2550 Pain in unspecified joint: Secondary | ICD-10-CM | POA: Diagnosis not present

## 2022-10-12 DIAGNOSIS — R972 Elevated prostate specific antigen [PSA]: Secondary | ICD-10-CM | POA: Diagnosis not present

## 2022-10-12 LAB — LAB REPORT - SCANNED
A1c: 5.7
eGFR: 62

## 2022-10-17 NOTE — Progress Notes (Unsigned)
Cardiology Clinic Note   Date: 10/18/2022 ID: Nazir, Panis April 29, 1945, MRN [id number]  Primary Cardiologist:  Peter Swaziland, MD  Patient Profile    Caleb Anderson is a 77 y.o. male who presents to the clinic today for routine follow up.     Past medical history significant for: CAD. LHC 12/29/2021 (NSTEMI): Proximal to mid RCA 30%.  RPDA 50%.  OM1 40%.  Proximal to mid LCx 70%.  OM 3 60%.  Ostial to mid LAD 80%.  D1 60%.  PCI with shockwave lithotripsy and DES to proximal LAD.  Recommendation for DAPT x 1 month.  DC aspirin after 1 month and continue Plavix along with Eliquis. PAF. Onset October 2023 in the setting of NSTEMI. Echo 12/28/2021: EF 60 to 65%.  Diastolic parameters indeterminate.  Normal RV function.  Moderate LAE.  Mild dilatation of aortic root 41 mm. Hypertension. Hyperlipidemia. Lipid panel 04/14/2022: LDL 32, HDL 52, TG 153, total 110.     History of Present Illness    Caleb Anderson was previously followed by Dr. Mayford Knife from June 2015 to September 2017 for CAD, hypertension, hyperlipidemia.  He was not seen again until 12/28/2021 during hospital admission for new onset A-fib.  Patient presented to the ED via EMS on 12/27/2021 for new onset A-fib that self converted en route.  Patient reported palpitations and warm sensation in face and jaw.  Initial assessment was A-fib with RVR rates 150 to 200 bpm.  Per EMS run sheet patient had 2 short runs of V. tach about 4 beats.  Prior to beginning diltiazem patient converted to NSR.  Troponin 78>> 146.  Patient underwent PCI with shockwave lithotripsy and DES to proximal LAD.  Plan for triple therapy x 1 month.  Stop aspirin after 1 month and continue Plavix along with Eliquis.  Patient was last seen in the by Dr. Swaziland on 04/14/2022 for routine follow-up.  He was doing well at that time and no changes were made.   Today, patient is accompanied by his wife. He is doing well. Patient denies shortness of breath or dyspnea  on exertion. No chest pain, pressure, or tightness. Denies lower extremity edema, orthopnea, or PND. No palpitations.  He is very active doing yard work and other outdoor chores, boating and golfing 18 holes at least once a week. He also does a lot of activities (like bike riding) with his great grandchildren. He has noticed that he feels a little more fatigued than usual the day after being active. He also notes occasional dizziness with and without position changes. Home BP is spot checked and SBP is typically in the 120s. He wonders if his medications are causing him to feel this way. Discussed medications at length with patient. Per his cath report he will be able to stop Plavix in October when he reaches the year mark from his PCI. I will reach out to Dr. Swaziland to confirm he can stop it at that time. He also inquires about switching Repatha to an oral medication. He is intolerant of all statins. Patient denies blood in stool or urine or other bleeding concerns.     ROS: All other systems reviewed and are otherwise negative except as noted in History of Present Illness.  Studies Reviewed    EKG Interpretation Date/Time:  Monday October 18 2022 15:12:03 EDT Ventricular Rate:  56 PR Interval:  138 QRS Duration:  80 QT Interval:  412 QTC Calculation: 397 R Axis:   -30  Text Interpretation: Sinus bradycardia Left axis deviation When compared with ECG of 30-Dec-2021 06:06, No significant change was found Confirmed by Carlos Levering [phone]) on 10/18/2022 3:23:25 PM    Risk Assessment/Calculations     CHA2DS2-VASc Score = 3   This indicates a 3.2% annual risk of stroke. The patient's score is based upon: CHF History: 0 HTN History: 1 Diabetes History: 0 Stroke History: 0 Vascular Disease History: 1 Age Score: 1 Gender Score: 0             Physical Exam    VS:  BP 128/86   Pulse (!) 56   Ht 5\' 5"  (1.651 m)   Wt 181 lb 3.2 oz (82.2 kg)   SpO2 96%   BMI 30.15 kg/m  , BMI Body  mass index is 30.15 kg/m.  GEN: Well nourished, well developed, in no acute distress. Neck: No JVD or carotid bruits. Cardiac:  RRR. No murmurs. No rubs or gallops.   Respiratory:  Respirations regular and unlabored. Clear to auscultation without rales, wheezing or rhonchi. GI: Soft, nontender, nondistended. Extremities: Radials/DP/PT 2+ and equal bilaterally. No clubbing or cyanosis. No edema.  Skin: Warm and dry, no rash. Neuro: Strength intact.  Assessment & Plan    CAD.  S/p PCI with shockwave lithotripsy and DES to proximal LAD October 2023. Patient denies chest pain, pressure, or tightness. He did not have chest pain with his cardiac event.  Continue Plavix, Eliquis, amlodipine, Repatha, as needed SL NTG. Per cath report, patient can stop Plavix when he reaches the year mark from his PCI. Will reach out to Dr. Swaziland to confirm patient can stop Plavix in October and will send a message to patient via MyChart.  PAF.  Onset October 2023 in the setting of NSTEMI.  Patient self converted to NSR en route to hospital with EMS. Patient denies palpitations. His awareness of arrhythmia included feeling as though something was wrong then feeling irregular rhythm when he checked his pulse. Denies spontaneous bleeding concerns. EKG shows sinus brady, 56 bpm.  Continue Eliquis. Hypertension: BP today 128/86. Home BP it spot checked and SBP normally in the 120s. Patient reports occasional dizziness with or without position changes. His wife feels he is sleeping more than he used to. He notices that he feels a little more fatigued after being active. Suggested splitting up amlodipine and lisinopril to take one in the morning and one in the evening. If this does not help would consider decreasing lisinopril to see if that helps. Continue amlodipine, lisinopril. Hyperlipidemia/myalgias with statins.  LDL January 2024 32, at goal.  Continue Repatha. Patient would like to know if he can switch to an oral  cholesterol medication. He is intolerant to all statins which caused myalgias. Will re-refer patient to pharm D lipid clinic.   Disposition: Refer to pharm D lipid clinic. Return in 6 months or sooner as needed.          Signed, Etta Grandchild. Afiya Ferrebee, DNP, NP-C

## 2022-10-18 ENCOUNTER — Encounter: Payer: Self-pay | Admitting: Student

## 2022-10-18 ENCOUNTER — Ambulatory Visit: Payer: Medicare Other | Admitting: Student

## 2022-10-18 VITALS — BP 128/86 | HR 56 | Ht 65.0 in | Wt 181.2 lb

## 2022-10-18 DIAGNOSIS — M791 Myalgia, unspecified site: Secondary | ICD-10-CM | POA: Insufficient documentation

## 2022-10-18 DIAGNOSIS — I10 Essential (primary) hypertension: Secondary | ICD-10-CM | POA: Insufficient documentation

## 2022-10-18 DIAGNOSIS — E785 Hyperlipidemia, unspecified: Secondary | ICD-10-CM | POA: Insufficient documentation

## 2022-10-18 DIAGNOSIS — I480 Paroxysmal atrial fibrillation: Secondary | ICD-10-CM | POA: Insufficient documentation

## 2022-10-18 DIAGNOSIS — I2510 Atherosclerotic heart disease of native coronary artery without angina pectoris: Secondary | ICD-10-CM | POA: Insufficient documentation

## 2022-10-18 DIAGNOSIS — T466X5A Adverse effect of antihyperlipidemic and antiarteriosclerotic drugs, initial encounter: Secondary | ICD-10-CM | POA: Insufficient documentation

## 2022-10-18 NOTE — Patient Instructions (Addendum)
Medication Instructions:  Your physician recommends that you continue on your current medications as directed. Please refer to the Current Medication list given to you today. If you need a refill on your cardiac medications before your next appointment, please call your pharmacy.   Lab Work: If you have labs (blood work) drawn today and your tests are completely normal, you will receive your results only by [employer] (if you have MyChart) OR A paper copy in the mail. If you have any lab test that is abnormal or we need to change your treatment, we will call you to review the results.   Testing/Procedures: none   Follow-Up: At [facility], you and your health needs are our priority.  As part of our continuing mission to provide you with exceptional heart care, we have created designated Provider Care Teams.  These Care Teams include your primary Cardiologist (physician) and Advanced Practice Providers (APPs -  Physician Assistants and Nurse Practitioners) who all work together to provide you with the care you need, when you need it.  We recommend signing up for the patient portal called "MyChart".  Sign up information is provided on this After Visit Summary.  MyChart is used to connect with patients for Virtual Visits (Telemedicine).  Patients are able to view lab/test results, encounter notes, upcoming appointments, etc.  Non-urgent messages can be sent to your provider as well.   To learn more about what you can do with MyChart, go to [email or web address].    Your next appointment:   6 month(s)  Provider:   Peter Swaziland, MD a letter will be sent to you as a reminder to call the office for you Feb 2025 appointment.

## 2022-10-25 ENCOUNTER — Other Ambulatory Visit: Payer: Self-pay | Admitting: Cardiology

## 2022-11-11 ENCOUNTER — Ambulatory Visit: Payer: Medicare Other | Attending: Cardiovascular Disease | Admitting: Student

## 2022-11-11 ENCOUNTER — Encounter: Payer: Self-pay | Admitting: Student

## 2022-11-11 ENCOUNTER — Telehealth: Payer: Self-pay | Admitting: Pharmacist

## 2022-11-11 DIAGNOSIS — E7800 Pure hypercholesterolemia, unspecified: Secondary | ICD-10-CM

## 2022-11-11 NOTE — Telephone Encounter (Signed)
Call to confirm pharmacy using Repatha [place] gant card to bring cost to $0  Per Corrie Dandy V at [facility] patient received 3 months supply of Repatha on May 30 for $0 and on Aug he received 1 month worth of supply for $0 too.   Call patient to clarify as per patient during visit he stated he has been paying ~$500 each time he gets prescription and pharmacy staff told him [employer] card is valid only once   Call patient N/A LVM to calrify.

## 2022-11-11 NOTE — Assessment & Plan Note (Signed)
Assessment and Plan :  LDL goal: <70 mg/dl last LDLc 32 mg/dl  LDL is well controlled on current therapy - Repatha [place] Q14d  but co pay is high  Enrolled patient in [employer] - was told by pharmacy grant card would work only once Will call pharmacy to clarify on grant coverage  Patient has supplement coverage, Leqvio can be mostly covered, will consider switching to Leqvio if unable to resolve [employer] issue.

## 2022-11-11 NOTE — Progress Notes (Signed)
Patient ID: Caleb Anderson                 DOB: [birth year]                    MRN: [id number]      HPI: Caleb Anderson is a 77 y.o. male patient referred to lipid clinic by Wyvonne Lenz. PMH is significant for CAD NSTEMI s/p PCI, PAF, HTN, statin myopathy, HLD  Patient's LDLc is at goal TG elevated -190 on current therapy Repatha and fish oil 2 gm daily. At last NP visit patient stated patient would like to know if he can switch to an oral cholesterol medication. He is intolerant to all statins which caused myalgias.   Patient presented today reports he has been tolerating Repatha well it is just cost prohibitive. Reports he was told by his pharmacy the grant card is one time deal. He eats healthy balanced meals and very active around the house. He has 9 years old grandson and he go for walk with him everyday ~45 min. He would like to stay on Repatha as he is tolerating it well and his LDLc is at goal. With supplementary insurance Leqvio can be an option which would be fully covered.   Current Medications: Repatha 140 mg Q14 d Intolerances: Statins- myalgia  Risk Factors: CAD NSTEMI s/p PCI, PAF, HTN, statin myopathy, HLD LDL goal: <70  Diet: low fat and low salt diet  Exercise: walking - 45 min day yard work and stays busy around the house   Family History:  Relation Problem Comments  Mother (Deceased) COPD     Father (Deceased) Kidney failure     Sister (Deceased) COPD     Brother (Alive) Heart attack at age 68    Heart disease      Social History: Alcohol: 3-4 drink per month  Smoking: ex smoker - quit 6 - 7 years ago Labs: Lipid Panel     Component Value Date/Time   CHOL 110 04/14/2022 0827   TRIG 153 (H) 04/14/2022 0827   HDL 52 04/14/2022 0827   CHOLHDL 2.1 04/14/2022 0827   CHOLHDL 5.2 12/28/2021 0700   VLDL 27 12/28/2021 0700   LDLCALC 32 04/14/2022 0827   LABVLDL 26 04/14/2022 0827    Past Medical History:  Diagnosis Date   Asbestosis (HCC)     Bradycardia 11/26/2015   Coronary artery disease    coronary artery calcifications by chest CT with normal nuclear stress test 2013   GERD (gastroesophageal reflux disease)    Glaucoma    Hyperlipidemia    Hypertension     Current Outpatient Medications on File Prior to Visit  Medication Sig Dispense Refill   acetaminophen (TYLENOL) 500 MG tablet Take 500-1,000 mg by mouth every 6 (six) hours as needed for moderate pain.     acetaminophen-codeine (TYLENOL #3) 300-30 MG tablet Take 1 tablet by mouth every 4 (four) hours as needed for moderate pain or severe pain.     amLODipine (NORVASC) 5 MG tablet TAKE 1 TABLET BY MOUTH EVERY DAY 90 tablet 3   apixaban (ELIQUIS) 5 MG TABS tablet Take 1 tablet (5 mg total) by mouth 2 (two) times daily. 180 tablet 1   clopidogrel (PLAVIX) 75 MG tablet Take 1 tablet (75 mg total) by mouth daily. 90 tablet 2   dorzolamide-timolol (COSOPT) 22.3-6.8 MG/ML ophthalmic solution Place 1 drop into both eyes at bedtime.     Evolocumab (REPATHA SURECLICK) 140 MG/ML  SOAJ Inject 140 mg into the skin every 14 (fourteen) days. 2 mL 11   lisinopril (ZESTRIL) 20 MG tablet TAKE 1 TABLET BY MOUTH EVERY DAY 90 tablet 2   nitroGLYCERIN (NITROSTAT) 0.4 MG SL tablet Place 1 tablet (0.4 mg total) under the tongue every 5 (five) minutes x 3 doses as needed for chest pain. (Patient not taking: Reported on 10/18/2022) 25 tablet 7   Omega-3 Fatty Acids (FISH OIL) 1000 MG CAPS Take 2,000 mg by mouth daily.     pantoprazole (PROTONIX) 40 MG tablet Take 1 tablet (40 mg total) by mouth daily. 30 tablet 11   tamsulosin (FLOMAX) 0.4 MG CAPS capsule Take 0.4 mg by mouth daily as needed (Patient states he does not need it).     No current facility-administered medications on file prior to visit.    Allergies  Allergen Reactions   Statins Other (See Comments)    Joint pain and fatigue(Lipitor, crestor, simvastatin, and pravastatin)   Trilipix [Choline Fenofibrate] Other (See Comments)     Joint pain/fatigue   Welchol [Colesevelam Hcl]     Aches    Zetia [Ezetimibe] Other (See Comments)    Joint pain/fatigue    Assessment/Plan:  1. Hyperlipidemia -  Problem  Hypercholesterolemia   Hypercholesterolemia Assessment and Plan :  LDL goal: <70 mg/dl last LDLc 32 mg/dl  LDL is well controlled on current therapy - Repatha [place] Q14d  but co pay is high  Enrolled patient in [employer] - was told by pharmacy grant card would work only once Will call pharmacy to clarify on grant coverage  Patient has supplement coverage, Leqvio can be mostly covered, will consider switching to Leqvio if unable to resolve [employer] issue.     Thank you,  Carmela Hurt, Pharm.D [facility] of [facility] [address], [place], [place] [address]  Phone: [phone]; Fax: [phone]

## 2022-11-12 ENCOUNTER — Telehealth: Payer: Self-pay | Admitting: Student

## 2022-11-12 MED ORDER — REPATHA SURECLICK 140 MG/ML ~~LOC~~ SOAJ: 140.0000 mg | SUBCUTANEOUS | 3 refills | Status: DC

## 2022-11-12 NOTE — Telephone Encounter (Signed)
Spoke to patient today, reports pharmacy is right. He received reimbursement of $475 for the May 3 months supply.  He was confuse. He will continue with Repatha. States refill was out so sent out renewals for 1 year

## 2022-11-12 NOTE — Telephone Encounter (Signed)
Pt would like to speak with the pharmacist. Please advise

## 2022-11-12 NOTE — Addendum Note (Signed)
Addended by: Tylene Fantasia on: 11/12/2022 01:06 PM   Modules accepted: Orders

## 2022-11-12 NOTE — Telephone Encounter (Signed)
Spoke to patient see other rencounter for details.

## 2022-11-23 DIAGNOSIS — R972 Elevated prostate specific antigen [PSA]: Secondary | ICD-10-CM | POA: Diagnosis not present

## 2022-11-23 DIAGNOSIS — N5201 Erectile dysfunction due to arterial insufficiency: Secondary | ICD-10-CM | POA: Diagnosis not present

## 2022-12-06 DIAGNOSIS — H02054 Trichiasis without entropian left upper eyelid: Secondary | ICD-10-CM | POA: Diagnosis not present

## 2022-12-06 DIAGNOSIS — H10503 Unspecified blepharoconjunctivitis, bilateral: Secondary | ICD-10-CM | POA: Diagnosis not present

## 2022-12-06 DIAGNOSIS — H00014 Hordeolum externum left upper eyelid: Secondary | ICD-10-CM | POA: Diagnosis not present

## 2022-12-06 DIAGNOSIS — H00011 Hordeolum externum right upper eyelid: Secondary | ICD-10-CM | POA: Diagnosis not present

## 2022-12-16 ENCOUNTER — Other Ambulatory Visit: Payer: Self-pay | Admitting: Cardiology

## 2022-12-22 ENCOUNTER — Other Ambulatory Visit: Payer: Self-pay | Admitting: Cardiology

## 2022-12-22 DIAGNOSIS — I480 Paroxysmal atrial fibrillation: Secondary | ICD-10-CM

## 2022-12-23 NOTE — Telephone Encounter (Signed)
Eliquis 5mg  refill request received. Patient is 77 years old, weight-82.2kg, Crea-1.16 on 04/14/22, Diagnosis-Afib, and last seen by Carlos Levering on 10/18/22. Dose is appropriate based on dosing criteria. Will send in refill to requested pharmacy.

## 2023-01-01 DIAGNOSIS — Z23 Encounter for immunization: Secondary | ICD-10-CM | POA: Diagnosis not present

## 2023-03-25 ENCOUNTER — Other Ambulatory Visit: Payer: Self-pay | Admitting: Cardiology

## 2023-03-29 DIAGNOSIS — R972 Elevated prostate specific antigen [PSA]: Secondary | ICD-10-CM | POA: Diagnosis not present

## 2023-04-05 ENCOUNTER — Telehealth: Payer: Self-pay | Admitting: Cardiology

## 2023-04-05 DIAGNOSIS — R972 Elevated prostate specific antigen [PSA]: Secondary | ICD-10-CM | POA: Diagnosis not present

## 2023-04-05 DIAGNOSIS — N5201 Erectile dysfunction due to arterial insufficiency: Secondary | ICD-10-CM | POA: Diagnosis not present

## 2023-04-05 NOTE — Telephone Encounter (Signed)
 Received a call from Caleb Anderson with [facility] calling to verify patient's blood thinners.After reviewing medication list patient is taking Eliquis 5 mg twice a day and Plavix 75 mg daily.Stated she will be faxing a surgical clearance to office.

## 2023-04-05 NOTE — Telephone Encounter (Signed)
   Pre-operative Risk Assessment    Patient Name: Caleb Anderson  DOB: Feb 05, 1946 MRN: [id number]   Date of last office visit: 10/18/2022 Date of next office visit: none   Request for Surgical Clearance    Procedure:   transrectal ultrasound prostate biopsy  Date of Surgery:  Clearance 06/14/23                                Surgeon:  Ricardo Likens, MD Surgeon's Group or Practice Name:  [facility] Phone number:  [phone] Fax number:  [phone]   Type of Clearance Requested:   - Medical  - Pharmacy:  Hold Clopidogrel  (Plavix ) and Apixaban  (Eliquis )     Type of Anesthesia:  None per [facility] RN   Additional requests/questions:    Signed, Jonel LITTIE Bucco   04/05/2023, 4:33 PM

## 2023-04-06 NOTE — Telephone Encounter (Signed)
 Pharmacy please advise on holding apixaban  prior to transrectal ultrasound prostate biopsy scheduled for 06/14/2023, Dr. Osborn Blaze. Thank you.

## 2023-04-07 NOTE — Telephone Encounter (Signed)
Called and spoke to patient and scheduled appointment for surgical clearance 04/26/23

## 2023-04-07 NOTE — Telephone Encounter (Signed)
   Name: Caleb Anderson  DOB: February 21, 1946  MRN: [id number]  Primary Cardiologist: Peter Swaziland, MD  Chart reviewed as part of pre-operative protocol coverage. Because of Caleb Anderson's past medical history and time since last visit, he will require a follow-up in-office visit in order to better assess preoperative cardiovascular risk.  Pre-op covering staff: - Please schedule appointment and call patient to inform them. If patient already had an upcoming appointment within acceptable timeframe, please add "pre-op clearance" to the appointment notes so provider is aware. - Please contact requesting surgeon's office via preferred method (i.e, phone, fax) to inform them of need for appointment prior to surgery.  Per office protocol, patient can hold Eliquis for 3 days prior to procedure.  Per office protocol, if patient is without any new symptoms or concerns at the time of their visit, he/she may hold Plavix for 5 days prior to procedure. Please resume Plavix as soon as possible postprocedure, at the discretion of the surgeon.      Joylene Grapes, NP  04/07/2023, 11:29 AM

## 2023-04-07 NOTE — Telephone Encounter (Signed)
Patient with diagnosis of A fib on Eliquis for anticoagulation.    Procedure: transrectal ultrasound prostate biopsy  Date of procedure: 06/14/23   CHA2DS2-VASc Score = 4  This indicates a 4.8% annual risk of stroke. The patient's score is based upon: CHF History: 0 HTN History: 1 Diabetes History: 0 Stroke History: 0 Vascular Disease History: 1 Age Score: 2 Gender Score: 0   CrCl 60 ml/min Platelet count 258k on 04/14/22   Per office protocol, patient can hold Eliquis for 3 days prior to procedure.    **This guidance is not considered finalized until pre-operative APP has relayed final recommendations.**

## 2023-04-13 ENCOUNTER — Encounter: Payer: Self-pay | Admitting: Cardiology

## 2023-04-13 ENCOUNTER — Ambulatory Visit: Payer: Medicare Other | Attending: Cardiology | Admitting: Cardiology

## 2023-04-13 DIAGNOSIS — I25118 Atherosclerotic heart disease of native coronary artery with other forms of angina pectoris: Secondary | ICD-10-CM | POA: Insufficient documentation

## 2023-04-13 DIAGNOSIS — E7800 Pure hypercholesterolemia, unspecified: Secondary | ICD-10-CM | POA: Insufficient documentation

## 2023-04-13 DIAGNOSIS — I10 Essential (primary) hypertension: Secondary | ICD-10-CM | POA: Diagnosis not present

## 2023-04-13 DIAGNOSIS — I480 Paroxysmal atrial fibrillation: Secondary | ICD-10-CM | POA: Insufficient documentation

## 2023-04-13 DIAGNOSIS — Z7901 Long term (current) use of anticoagulants: Secondary | ICD-10-CM | POA: Diagnosis not present

## 2023-04-13 NOTE — Patient Instructions (Signed)
Medication Instructions:  Continue same medications *If you need a refill on your cardiac medications before your next appointment, please call your pharmacy*   Lab Work: None ordered   Testing/Procedures: None ordered   Follow-Up: At [facility], you and your health needs are our priority.  As part of our continuing mission to provide you with exceptional heart care, we have created designated Provider Care Teams.  These Care Teams include your primary Cardiologist (physician) and Advanced Practice Providers (APPs -  Physician Assistants and Nurse Practitioners) who all work together to provide you with the care you need, when you need it.  We recommend signing up for the patient portal called "MyChart".  Sign up information is provided on this After Visit Summary.  MyChart is used to connect with patients for Virtual Visits (Telemedicine).  Patients are able to view lab/test results, encounter notes, upcoming appointments, etc.  Non-urgent messages can be sent to your provider as well.   To learn more about what you can do with MyChart, go to [email or web address].    Your next appointment:  6 months   Call in April to schedule July appointment    ( New Office )    Provider:  Dr.Jordan

## 2023-04-13 NOTE — Progress Notes (Signed)
Cardiology Office Note:    Date:  04/13/2023   ID:  Caleb Anderson, DOB 04/02/1945, MRN [id number]  PCP:  Lupita Raider, MD   [facility] Providers Cardiologist:  Kemia Wendel [place], MD Cardiology APP:  Marcelino Duster, [place] { Referring MD: Lupita Raider, MD   Chief Complaint  Patient presents with   Coronary Artery Disease   Atrial Fibrillation  NSTEMI  History of Present Illness:    Caleb Anderson is a 78 y.o. male with a hx of CAD, hypertension, hyperlipidemia, BPH, anemia, glaucoma, and A-fib.  He  has been intolerant to multiple statins.  Nuclear stress test nonischemic in 2013.  He was hospitalized 10/8 - 12/30/21 with NSTEMI found to have new A-fib RVR.  RVR rates were in the 1 50-200 range.  She spontaneously converted to sinus bradycardia. HS troponin trended to 146 and she underwent heart catheterization 12/29/2021 found to have single-vessel obstructive CAD in the proximal LAD treated with lithotripsy and DES times one 3.0 x 24 mm.  CAD in the LCx was not hemodynamically significant by FFR.  LVEDP was 16 mmHg.  Echocardiogram revealed LVEF 65 to 65%, normal RV, no significant valvular disease.  She does have an aortic root dilation of 41 mm.   He has several statin intolerances. Is now on Repatha. Able to continue as long as grant holds out. He does like walking outdoors and doing yard work. Has 4 great grandchildren. He reports BP usually < 120-130 systolic at home. Takes every other day.  He feels well. No palpitations, dizziness, chest pain or dyspnea. No bleeding.    Past Medical History:  Diagnosis Date   Asbestosis (HCC)    Bradycardia 11/26/2015   Coronary artery disease    coronary artery calcifications by chest CT with normal nuclear stress test 2013   GERD (gastroesophageal reflux disease)    Glaucoma    Hyperlipidemia    Hypertension     Past Surgical History:  Procedure Laterality Date   CORONARY LITHOTRIPSY N/A 12/29/2021   Procedure:  CORONARY LITHOTRIPSY;  Surgeon: [place], Eula Mazzola M, MD;  Location: [facility] INVASIVE CV LAB;  Service: Cardiovascular;  Laterality: N/A;   CORONARY PRESSURE/FFR STUDY N/A 12/29/2021   Procedure: INTRAVASCULAR PRESSURE WIRE/FFR STUDY;  Surgeon: [place], Ilia Dimaano M, MD;  Location: [facility] INVASIVE CV LAB;  Service: Cardiovascular;  Laterality: N/A;   CORONARY STENT INTERVENTION N/A 12/29/2021   Procedure: CORONARY STENT INTERVENTION;  Surgeon: [place], Garcia Dalzell M, MD;  Location: [facility] INVASIVE CV LAB;  Service: Cardiovascular;  Laterality: N/A;   LEFT HEART CATH AND CORONARY ANGIOGRAPHY N/A 12/29/2021   Procedure: LEFT HEART CATH AND CORONARY ANGIOGRAPHY;  Surgeon: [place], Kalimah Capurro M, MD;  Location: [facility] INVASIVE CV LAB;  Service: Cardiovascular;  Laterality: N/A;    Current Medications: Current Meds  Medication Sig   acetaminophen (TYLENOL) 500 MG tablet Take 500-1,000 mg by mouth every 6 (six) hours as needed for moderate pain.   acetaminophen-codeine (TYLENOL #3) 300-30 MG tablet Take 1 tablet by mouth every 4 (four) hours as needed for moderate pain or severe pain.   amLODipine (NORVASC) 5 MG tablet TAKE 1 TABLET BY MOUTH EVERY DAY   apixaban (ELIQUIS) 5 MG TABS tablet TAKE 1 TABLET BY MOUTH TWICE A DAY   dorzolamide-timolol (COSOPT) 22.3-6.8 MG/ML ophthalmic solution Place 1 drop into both eyes at bedtime.   Evolocumab (REPATHA SURECLICK) 140 MG/ML SOAJ Inject 140 mg into the skin every 14 (fourteen) days.   lisinopril (ZESTRIL) 20 MG tablet TAKE  1 TABLET BY MOUTH EVERY DAY   nitroGLYCERIN (NITROSTAT) 0.4 MG SL tablet Place 1 tablet (0.4 mg total) under the tongue every 5 (five) minutes x 3 doses as needed for chest pain.   Omega-3 Fatty Acids (FISH OIL) 1000 MG CAPS Take 2,000 mg by mouth daily.   pantoprazole (PROTONIX) 40 MG tablet TAKE 1 TABLET BY MOUTH EVERY DAY   tamsulosin (FLOMAX) 0.4 MG CAPS capsule Take 0.4 mg by mouth daily as needed (Patient states he does not need it).     Allergies:   Statins, Trilipix  [choline fenofibrate], Welchol [colesevelam hcl], and Zetia [ezetimibe]   Social History   Socioeconomic History   Marital status: Married    Spouse name: Not on file   Number of children: Not on file   Years of education: Not on file   Highest education level: Not on file  Occupational History   Not on file  Tobacco Use   Smoking status: Former    Current packs/day: 0.00    Average packs/day: 1 pack/day for 15.0 years (15.0 ttl pk-yrs)    Types: Cigarettes    Start date: 03/23/1975    Quit date: 03/22/1990    Years since quitting: 33.0   Smokeless tobacco: Never  Substance and Sexual Activity   Alcohol use: Yes    Alcohol/week: 0.0 standard drinks of alcohol    Comment: ocassionally   Drug use: No   Sexual activity: Not on file  Other Topics Concern   Not on file  Social History Narrative   Not on file   Social Drivers of Health   Financial Resource Strain: Not on file  Food Insecurity: No Food Insecurity (12/29/2021)   Hunger Vital Sign    Worried About Running Out of Food in the Last Year: Never true    Ran Out of Food in the Last Year: Never true  Transportation Needs: No Transportation Needs (12/29/2021)   PRAPARE - Administrator, Civil Service (Medical): No    Lack of Transportation (Non-Medical): No  Physical Activity: Not on file  Stress: Not on file  Social Connections: Not on file     Family History: The patient's family history includes COPD in his mother and sister; Heart attack in his brother; Heart disease in his brother; Kidney failure in his father.  ROS:   Please see the history of present illness.     All other systems reviewed and are negative.  EKGs/Labs/Other Studies Reviewed:    The following studies were reviewed today:  Cath: 12/29/21     Prox RCA to Mid RCA lesion is 30% stenosed.   RPDA lesion is 50% stenosed.   1st Mrg lesion is 40% stenosed.   Prox Cx to Mid Cx lesion is 70% stenosed.   3rd Mrg lesion is 60%  stenosed.   Ost LAD to Mid LAD lesion is 80% stenosed.   1st Diag lesion is 60% stenosed.   A drug-eluting stent was successfully placed using a SYNERGY XD 3.0X24.   Post intervention, there is a 0% residual stenosis.   LV end diastolic pressure is mildly elevated.   Single vessel obstructive CAD. Diffuse CAD. Lesion in the LCx is not hemodynamically significant by FFR Mildly elevated LVEDP 16 mm Hg Successful PCI of the proximal LAD with Shockwave lithotripsy and DES x 1. Preserved flow in the first diagonal   Plan: DAPT with ASA for one month. Plavix for 12 months. Concomitant anticoagulation with Eliquis- may begin tonight.  Anticipate DC tomorrow. Treat other CAD medically.    Diagnostic Dominance: Right  Intervention      Echo: 12/28/2021   IMPRESSIONS     1. Left ventricular ejection fraction, by estimation, is 60 to 65%. Left  ventricular ejection fraction by 3D volume is 60 %. The left ventricle has  normal function. The left ventricle has no regional wall motion  abnormalities. Left ventricular diastolic   parameters are indeterminate.   2. Right ventricular systolic function is normal. The right ventricular  size is normal.   3. Left atrial size was moderately dilated.   4. The mitral valve is normal in structure. No evidence of mitral valve  regurgitation. No evidence of mitral stenosis.   5. The aortic valve is normal in structure. Aortic valve regurgitation is  not visualized. No aortic stenosis is present.   6. Aortic dilatation noted. There is mild dilatation of the aortic root,  measuring 41 mm.   7. The inferior vena cava is normal in size with greater than 50%  respiratory variability, suggesting right atrial pressure of 3 mmHg.    EKG Interpretation Date/Time:  Wednesday April 13 2023 11:44:48 EST Ventricular Rate:  62 PR Interval:  136 QRS Duration:  76 QT Interval:  392 QTC Calculation: 397 R Axis:   -31  Text Interpretation: Normal sinus rhythm  Left axis deviation When compared with ECG of 18-Oct-2022 15:12, No significant change was found Confirmed by [place], Corona Popovich [phone]) on 04/13/2023 12:00:56 PM     Recent Labs: 04/14/2022: ALT 13; BUN 15; Creatinine, Ser 1.16; Hemoglobin 14.4; Platelets 258; Potassium 4.8; Sodium 139  Recent Lipid Panel    Component Value Date/Time   CHOL 110 04/14/2022 0827   TRIG 153 (H) 04/14/2022 0827   HDL 52 04/14/2022 0827   CHOLHDL 2.1 04/14/2022 0827   CHOLHDL 5.2 12/28/2021 0700   VLDL 27 12/28/2021 0700   LDLCALC 32 04/14/2022 0827   Dated 10/12/22: cholesterol 124, triglycerides 190, LDL 45, HDL 48, A1c 5.7%. chemistries normal  Risk Assessment/Calculations:    CHA2DS2-VASc Score = 4   This indicates a 4.8% annual risk of stroke. The patient's score is based upon: CHF History: 0 HTN History: 1 Diabetes History: 0 Stroke History: 0 Vascular Disease History: 1 Age Score: 2 Gender Score: 0          Physical Exam:    VS:  BP (!) 166/78 (BP Location: Left Arm, Patient Position: Sitting, Cuff Size: Normal)   Pulse 62   Resp (!) 97   Ht 5\' 6"  (1.676 m)   Wt 182 lb (82.6 kg)   BMI 29.38 kg/m     Wt Readings from Last 3 Encounters:  04/13/23 182 lb (82.6 kg)  10/18/22 181 lb 3.2 oz (82.2 kg)  04/14/22 187 lb (84.8 kg)     GEN:  Well nourished, well developed in no acute distress HEENT: Normal NECK: No JVD; No carotid bruits LYMPHATICS: No lymphadenopathy CARDIAC: RRR, no murmurs, rubs, gallops RESPIRATORY:  Clear to auscultation without rales, wheezing or rhonchi  ABDOMEN: Soft, non-tender, non-distended MUSCULOSKELETAL:  No edema; No deformity  SKIN: Warm and dry NEUROLOGIC:  Alert and oriented x 3 PSYCHIATRIC:  Normal affect  Right radial cath site C/D/I  ASSESSMENT:    1. Coronary artery disease of native artery of native heart with stable angina pectoris (HCC)   2. Chronic anticoagulation   3. Primary hypertension   4. PAF (paroxysmal atrial fibrillation) (HCC)    5. Hypercholesterolemia  PLAN:    In order of problems listed above:  CAD NSTEMI DES-LAD 12/29/21 Nonobstructive disease in LCx treated medically. FFR OK. Stressed risk factor modification.  No antiplatelet therapy since on Eliquis No beta-blocker  given bradycardia He has no significant angina On amlodipine  PAF with RVR Sinus bradycardia Maintaining NSR Continue Eliquis  Need for anticoagulation CHA2DS2-VASc of 4 Continue Eliquis, no bleeding issues  Hyperlipidemia with LDL goal < 70 Last LDL in July 45.  LP (a) 18.6 Statin intolerant- Now on  Repatha.    HTN BP high today but patient reports good readings at home.  Continue amlodipine and lisinopril Will monitor at home and if staying over 130 systolic will need additional therapy       Follow up in 6 months.   Medication Adjustments/Labs and Tests Ordered: Current medicines are reviewed at length with the patient today.  Concerns regarding medicines are outlined above.  Orders Placed This Encounter  Procedures   EKG 12-Lead   No orders of the defined types were placed in this encounter.   Patient Instructions  Medication Instructions:  Continue same medications *If you need a refill on your cardiac medications before your next appointment, please call your pharmacy*   Lab Work: None ordered   Testing/Procedures: None ordered   Follow-Up: At [facility], you and your health needs are our priority.  As part of our continuing mission to provide you with exceptional heart care, we have created designated Provider Care Teams.  These Care Teams include your primary Cardiologist (physician) and Advanced Practice Providers (APPs -  Physician Assistants and Nurse Practitioners) who all work together to provide you with the care you need, when you need it.  We recommend signing up for the patient portal called "MyChart".  Sign up information is provided on this After Visit Summary.  MyChart  is used to connect with patients for Virtual Visits (Telemedicine).  Patients are able to view lab/test results, encounter notes, upcoming appointments, etc.  Non-urgent messages can be sent to your provider as well.   To learn more about what you can do with MyChart, go to [email or web address].    Your next appointment:  6 months  Call in April to schedule July appointment  ( New Office )    Provider:  Dr.Vyom Brass          Signed, Achilles Neville [place], MD  04/13/2023 12:10 PM    [facility]

## 2023-04-26 ENCOUNTER — Ambulatory Visit: Payer: Medicare Other | Admitting: Student

## 2023-04-26 DIAGNOSIS — M1990 Unspecified osteoarthritis, unspecified site: Secondary | ICD-10-CM | POA: Diagnosis not present

## 2023-04-26 DIAGNOSIS — E782 Mixed hyperlipidemia: Secondary | ICD-10-CM | POA: Diagnosis not present

## 2023-04-26 DIAGNOSIS — Z0000 Encounter for general adult medical examination without abnormal findings: Secondary | ICD-10-CM | POA: Diagnosis not present

## 2023-04-26 DIAGNOSIS — I25119 Atherosclerotic heart disease of native coronary artery with unspecified angina pectoris: Secondary | ICD-10-CM | POA: Diagnosis not present

## 2023-04-26 DIAGNOSIS — I4891 Unspecified atrial fibrillation: Secondary | ICD-10-CM | POA: Diagnosis not present

## 2023-04-26 DIAGNOSIS — E669 Obesity, unspecified: Secondary | ICD-10-CM | POA: Diagnosis not present

## 2023-04-26 DIAGNOSIS — H409 Unspecified glaucoma: Secondary | ICD-10-CM | POA: Diagnosis not present

## 2023-04-26 DIAGNOSIS — N400 Enlarged prostate without lower urinary tract symptoms: Secondary | ICD-10-CM | POA: Diagnosis not present

## 2023-04-26 DIAGNOSIS — L719 Rosacea, unspecified: Secondary | ICD-10-CM | POA: Diagnosis not present

## 2023-04-26 DIAGNOSIS — R7303 Prediabetes: Secondary | ICD-10-CM | POA: Diagnosis not present

## 2023-04-26 DIAGNOSIS — K219 Gastro-esophageal reflux disease without esophagitis: Secondary | ICD-10-CM | POA: Diagnosis not present

## 2023-04-26 DIAGNOSIS — I119 Hypertensive heart disease without heart failure: Secondary | ICD-10-CM | POA: Diagnosis not present

## 2023-04-26 LAB — LAB REPORT - SCANNED
A1c: 5.7
EGFR: 62

## 2023-06-14 DIAGNOSIS — N411 Chronic prostatitis: Secondary | ICD-10-CM | POA: Diagnosis not present

## 2023-06-14 DIAGNOSIS — C61 Malignant neoplasm of prostate: Secondary | ICD-10-CM | POA: Diagnosis not present

## 2023-06-14 DIAGNOSIS — R972 Elevated prostate specific antigen [PSA]: Secondary | ICD-10-CM | POA: Diagnosis not present

## 2023-06-20 DIAGNOSIS — C61 Malignant neoplasm of prostate: Secondary | ICD-10-CM | POA: Diagnosis not present

## 2023-06-20 DIAGNOSIS — N5201 Erectile dysfunction due to arterial insufficiency: Secondary | ICD-10-CM | POA: Diagnosis not present

## 2023-07-23 ENCOUNTER — Other Ambulatory Visit: Payer: Self-pay | Admitting: Cardiology

## 2023-07-23 DIAGNOSIS — I480 Paroxysmal atrial fibrillation: Secondary | ICD-10-CM

## 2023-07-25 NOTE — Telephone Encounter (Signed)
 Prescription refill request for Eliquis  received. Indication: PAF Last office visit: 04/13/23  P Sw[place] MD Scr: 1.21 on 04/26/23  Epic Age: 78 Weight: 82.6kg  Based on above findings Eliquis  5mg  twice daily is the appropriate dose.  Refill approved.

## 2023-09-16 ENCOUNTER — Other Ambulatory Visit: Payer: Self-pay | Admitting: Cardiology

## 2023-09-21 ENCOUNTER — Other Ambulatory Visit: Payer: Self-pay

## 2023-09-21 MED ORDER — LISINOPRIL 20 MG PO TABS: 20.0000 mg | ORAL_TABLET | Freq: Every day | ORAL | 1 refills | Status: DC

## 2023-10-07 DIAGNOSIS — Z961 Presence of intraocular lens: Secondary | ICD-10-CM | POA: Diagnosis not present

## 2023-10-07 DIAGNOSIS — H401131 Primary open-angle glaucoma, bilateral, mild stage: Secondary | ICD-10-CM | POA: Diagnosis not present

## 2023-10-19 ENCOUNTER — Other Ambulatory Visit: Payer: Self-pay

## 2023-10-19 MED ORDER — AMLODIPINE BESYLATE 5 MG PO TABS: 5.0000 mg | ORAL_TABLET | Freq: Every day | ORAL | 1 refills | Status: DC

## 2023-10-26 DIAGNOSIS — I119 Hypertensive heart disease without heart failure: Secondary | ICD-10-CM | POA: Diagnosis not present

## 2023-10-26 DIAGNOSIS — E782 Mixed hyperlipidemia: Secondary | ICD-10-CM | POA: Diagnosis not present

## 2023-10-26 DIAGNOSIS — R7303 Prediabetes: Secondary | ICD-10-CM | POA: Diagnosis not present

## 2023-10-26 DIAGNOSIS — I4891 Unspecified atrial fibrillation: Secondary | ICD-10-CM | POA: Diagnosis not present

## 2023-10-26 DIAGNOSIS — I25119 Atherosclerotic heart disease of native coronary artery with unspecified angina pectoris: Secondary | ICD-10-CM | POA: Diagnosis not present

## 2023-10-26 LAB — LAB REPORT - SCANNED
A1c: 5.7
Albumin, Urine POC: <0.7
Creatinine, POC: 159 mg/dL
EGFR: 70
Microalb Creat Ratio: <4.4

## 2023-11-10 ENCOUNTER — Other Ambulatory Visit: Payer: Self-pay | Admitting: Cardiology

## 2023-11-10 DIAGNOSIS — M791 Myalgia, unspecified site: Secondary | ICD-10-CM

## 2023-11-10 DIAGNOSIS — I25118 Atherosclerotic heart disease of native coronary artery with other forms of angina pectoris: Secondary | ICD-10-CM

## 2023-11-10 DIAGNOSIS — E7800 Pure hypercholesterolemia, unspecified: Secondary | ICD-10-CM

## 2023-11-10 DIAGNOSIS — E785 Hyperlipidemia, unspecified: Secondary | ICD-10-CM

## 2023-12-12 DIAGNOSIS — H0014 Chalazion left upper eyelid: Secondary | ICD-10-CM | POA: Diagnosis not present

## 2023-12-12 DIAGNOSIS — H02054 Trichiasis without entropian left upper eyelid: Secondary | ICD-10-CM | POA: Diagnosis not present

## 2023-12-12 DIAGNOSIS — H01004 Unspecified blepharitis left upper eyelid: Secondary | ICD-10-CM | POA: Diagnosis not present

## 2023-12-12 DIAGNOSIS — H01001 Unspecified blepharitis right upper eyelid: Secondary | ICD-10-CM | POA: Diagnosis not present

## 2023-12-19 DIAGNOSIS — C61 Malignant neoplasm of prostate: Secondary | ICD-10-CM | POA: Diagnosis not present

## 2023-12-26 DIAGNOSIS — C61 Malignant neoplasm of prostate: Secondary | ICD-10-CM | POA: Diagnosis not present

## 2023-12-27 ENCOUNTER — Other Ambulatory Visit: Payer: Self-pay | Admitting: Urology

## 2023-12-27 DIAGNOSIS — C61 Malignant neoplasm of prostate: Secondary | ICD-10-CM

## 2024-01-02 ENCOUNTER — Other Ambulatory Visit (HOSPITAL_COMMUNITY): Payer: Self-pay

## 2024-01-07 DIAGNOSIS — Z23 Encounter for immunization: Secondary | ICD-10-CM | POA: Diagnosis not present

## 2024-01-09 ENCOUNTER — Other Ambulatory Visit: Payer: Self-pay

## 2024-01-09 DIAGNOSIS — H01004 Unspecified blepharitis left upper eyelid: Secondary | ICD-10-CM | POA: Diagnosis not present

## 2024-01-21 ENCOUNTER — Other Ambulatory Visit: Payer: Self-pay | Admitting: Cardiology

## 2024-01-21 DIAGNOSIS — I480 Paroxysmal atrial fibrillation: Secondary | ICD-10-CM

## 2024-01-23 NOTE — Telephone Encounter (Signed)
 Pt last saw Dr Jordan 04/13/23, last labs 10/26/23 Creat 1.09, age 78, weight 82.6kg, based on specified criteria pt is on appropriate dosage of Eliquis  5mg  BID for afib.  Will refill rx.

## 2024-01-26 ENCOUNTER — Other Ambulatory Visit: Payer: Self-pay | Admitting: Cardiology

## 2024-01-26 DIAGNOSIS — I25118 Atherosclerotic heart disease of native coronary artery with other forms of angina pectoris: Secondary | ICD-10-CM

## 2024-01-26 DIAGNOSIS — E785 Hyperlipidemia, unspecified: Secondary | ICD-10-CM

## 2024-01-26 DIAGNOSIS — T466X5A Adverse effect of antihyperlipidemic and antiarteriosclerotic drugs, initial encounter: Secondary | ICD-10-CM

## 2024-01-26 DIAGNOSIS — E7800 Pure hypercholesterolemia, unspecified: Secondary | ICD-10-CM

## 2024-02-07 ENCOUNTER — Other Ambulatory Visit

## 2024-03-15 ENCOUNTER — Other Ambulatory Visit: Payer: Self-pay | Admitting: Cardiology

## 2024-03-19 ENCOUNTER — Telehealth: Payer: Self-pay | Admitting: Cardiology

## 2024-03-19 MED ORDER — PANTOPRAZOLE SODIUM 40 MG PO TBEC: 40.0000 mg | DELAYED_RELEASE_TABLET | Freq: Every day | ORAL | 0 refills | Status: AC

## 2024-03-19 NOTE — Telephone Encounter (Signed)
" °*  STAT* If patient is at the pharmacy, call can be transferred to refill team.   1. Which medications need to be refilled? (please list name of each medication and dose if known) pantoprazole  (PROTONIX ) 40 MG tablet    2. Would you like to learn more about the convenience, safety, & potential cost savings by using the [facility]? No   3. Are you open to using the [facility] (Type [facility].) No   4. Which pharmacy/location (including street and city if local pharmacy) is medication to be sent to? [id number] - [place], [place] - [address]    5. Do they need a 30 day or 90 day supply? 90 day   "

## 2024-03-19 NOTE — Telephone Encounter (Signed)
 Requested Prescriptions   Signed Prescriptions Disp Refills   pantoprazole  (PROTONIX ) 40 MG tablet 90 tablet 0    Sig: Take 1 tablet (40 mg total) by mouth daily.    Authorizing Provider: JORDAN, PETER M    Ordering User: WILFRED, Albertha Beattie  C

## 2024-03-23 NOTE — Progress Notes (Signed)
 " Cardiology Office Note:    Date:  03/27/2024   ID:  Caleb Anderson, DOB 09-24-1945, MRN [id number]  PCP:  Loreli Kins, MD   [facility] Providers Cardiologist:  Latriece Anstine, MD Cardiology APP:  Madie Jon Garre, [place] { Referring MD: Loreli Kins, MD   No chief complaint on file.   History of Present Illness:    Caleb Anderson is a 79 y.o. male with a hx of CAD, hypertension, hyperlipidemia, BPH, anemia, glaucoma, and A-fib.  He  has been intolerant to multiple statins.  Nuclear stress test nonischemic in 2013.  He was hospitalized 10/8 - 12/30/21 with NSTEMI found to have new A-fib RVR.  RVR rates were in the 1 50-200 range.  He spontaneously converted to sinus bradycardia. HS troponin trended to 146 and she underwent heart catheterization 12/29/2021 found to have single-vessel obstructive CAD in the proximal LAD treated with lithotripsy and DES times one 3.0 x 24 mm.  CAD in the LCx was not hemodynamically significant by FFR.  LVEDP was 16 mmHg.  Echocardiogram revealed LVEF 65 to 65%, normal RV, no significant valvular disease. He does have an aortic root dilation of 41 mm.   He has several statin intolerances. Is now on Repatha . Able to continue as long as grant holds out. He does like walking outdoors and doing yard work. Has 4 great grandchildren.   He feels well. No palpitations, dizziness, chest pain or dyspnea. No bleeding.    Past Medical History:  Diagnosis Date   Asbestosis (HCC)    Bradycardia 11/26/2015   Coronary artery disease    coronary artery calcifications by chest CT with normal nuclear stress test 2013   GERD (gastroesophageal reflux disease)    Glaucoma    Hyperlipidemia    Hypertension     Past Surgical History:  Procedure Laterality Date   CORONARY LITHOTRIPSY N/A 12/29/2021   Procedure: CORONARY LITHOTRIPSY;  Surgeon: Kaneesha Constantino M, MD;  Location: [facility] INVASIVE CV LAB;  Service: Cardiovascular;  Laterality: N/A;   CORONARY  PRESSURE/FFR STUDY N/A 12/29/2021   Procedure: INTRAVASCULAR PRESSURE WIRE/FFR STUDY;  Surgeon: Johnny Latu M, MD;  Location: [facility] INVASIVE CV LAB;  Service: Cardiovascular;  Laterality: N/A;   CORONARY STENT INTERVENTION N/A 12/29/2021   Procedure: CORONARY STENT INTERVENTION;  Surgeon: Jolissa Kapral M, MD;  Location: [facility] INVASIVE CV LAB;  Service: Cardiovascular;  Laterality: N/A;   LEFT HEART CATH AND CORONARY ANGIOGRAPHY N/A 12/29/2021   Procedure: LEFT HEART CATH AND CORONARY ANGIOGRAPHY;  Surgeon: Kween Bacorn M, MD;  Location: [facility] INVASIVE CV LAB;  Service: Cardiovascular;  Laterality: N/A;    Current Medications: Current Meds  Medication Sig   acetaminophen  (TYLENOL ) 500 MG tablet Take 500-1,000 mg by mouth every 6 (six) hours as needed for moderate pain.   acetaminophen -codeine (TYLENOL  #3) 300-30 MG tablet Take 1 tablet by mouth every 4 (four) hours as needed for moderate pain or severe pain.   amLODipine  (NORVASC ) 5 MG tablet Take 1 tablet (5 mg total) by mouth daily.   dorzolamide -timolol  (COSOPT ) 22.3-6.8 MG/ML ophthalmic solution Place 1 drop into both eyes at bedtime.   ELIQUIS  5 MG TABS tablet TAKE 1 TABLET BY MOUTH TWICE A DAY   Evolocumab  (REPATHA  SURECLICK) 140 MG/ML SOAJ INJECT 140 MG INTO THE SKIN EVERY 14 (FOURTEEN) DAYS.   lisinopril  (ZESTRIL ) 20 MG tablet Take 1 tablet (20 mg total) by mouth daily. Pt needs to schedule appt with provider for further refills - 1st attempt  nitroGLYCERIN  (NITROSTAT ) 0.4 MG SL tablet Place 1 tablet (0.4 mg total) under the tongue every 5 (five) minutes x 3 doses as needed for chest pain.   Omega-3 Fatty Acids (FISH OIL) 1000 MG CAPS Take 2,000 mg by mouth daily.   pantoprazole  (PROTONIX ) 40 MG tablet Take 1 tablet (40 mg total) by mouth daily.   tamsulosin  (FLOMAX ) 0.4 MG CAPS capsule Take 0.4 mg by mouth daily as needed (Patient states he does not need it).     Allergies:   Statins, Trilipix [choline fenofibrate], Welchol [colesevelam  hcl], and Zetia [ezetimibe]   Social History   Socioeconomic History   Marital status: Married    Spouse name: Not on file   Number of children: Not on file   Years of education: Not on file   Highest education level: Not on file  Occupational History   Not on file  Tobacco Use   Smoking status: Former    Current packs/day: 0.00    Average packs/day: 1 pack/day for 15.0 years (15.0 ttl pk-yrs)    Types: Cigarettes    Start date: 03/23/1975    Quit date: 03/22/1990    Years since quitting: 34.0   Smokeless tobacco: Never  Substance and Sexual Activity   Alcohol use: Yes    Alcohol/week: 0.0 standard drinks of alcohol    Comment: ocassionally   Drug use: No   Sexual activity: Not on file  Other Topics Concern   Not on file  Social History Narrative   Not on file   Social Drivers of Health   Tobacco Use: Medium Risk (03/27/2024)   Patient History    Smoking Tobacco Use: Former    Smokeless Tobacco Use: Never    Passive Exposure: Not on Actuary Strain: Not on file  Food Insecurity: No Food Insecurity (12/29/2021)   Hunger Vital Sign    Worried About Running Out of Food in the Last Year: Never true    Ran Out of Food in the Last Year: Never true  Transportation Needs: No Transportation Needs (12/29/2021)   PRAPARE - Administrator, Civil Service (Medical): No    Lack of Transportation (Non-Medical): No  Physical Activity: Not on file  Stress: Not on file  Social Connections: Not on file  Depression (EYV7-0): Not on file  Alcohol Screen: Not on file  Housing: Low Risk (12/29/2021)   Housing    Last Housing Risk Score: 0  Utilities: Not At Risk (12/29/2021)   AHC Utilities    Threatened with loss of utilities: No  Health Literacy: Not on file     Family History: The patient's family history includes COPD in his mother and sister; Heart attack in his brother; Heart disease in his brother; Kidney failure in his father.  ROS:   Please see  the history of present illness.     All other systems reviewed and are negative.  EKGs/Labs/Other Studies Reviewed:    The following studies were reviewed today:  Cath: 12/29/21     Prox RCA to Mid RCA lesion is 30% stenosed.   RPDA lesion is 50% stenosed.   1st Mrg lesion is 40% stenosed.   Prox Cx to Mid Cx lesion is 70% stenosed.   3rd Mrg lesion is 60% stenosed.   Ost LAD to Mid LAD lesion is 80% stenosed.   1st Diag lesion is 60% stenosed.   A drug-eluting stent was successfully placed using a SYNERGY XD 3.0X24.   Post  intervention, there is a 0% residual stenosis.   LV end diastolic pressure is mildly elevated.   Single vessel obstructive CAD. Diffuse CAD. Lesion in the LCx is not hemodynamically significant by FFR Mildly elevated LVEDP 16 mm Hg Successful PCI of the proximal LAD with Shockwave lithotripsy and DES x 1. Preserved flow in the first diagonal   Plan: DAPT with ASA for one month. Plavix  for 12 months. Concomitant anticoagulation with Eliquis - may begin tonight. Anticipate DC tomorrow. Treat other CAD medically.    Diagnostic Dominance: Right  Intervention      Echo: 12/28/2021   IMPRESSIONS     1. Left ventricular ejection fraction, by estimation, is 60 to 65%. Left  ventricular ejection fraction by 3D volume is 60 %. The left ventricle has  normal function. The left ventricle has no regional wall motion  abnormalities. Left ventricular diastolic   parameters are indeterminate.   2. Right ventricular systolic function is normal. The right ventricular  size is normal.   3. Left atrial size was moderately dilated.   4. The mitral valve is normal in structure. No evidence of mitral valve  regurgitation. No evidence of mitral stenosis.   5. The aortic valve is normal in structure. Aortic valve regurgitation is  not visualized. No aortic stenosis is present.   6. Aortic dilatation noted. There is mild dilatation of the aortic root,  measuring 41 mm.    7. The inferior vena cava is normal in size with greater than 50%  respiratory variability, suggesting right atrial pressure of 3 mmHg.    EKG Interpretation Date/Time:  Tuesday March 27 2024 15:56:27 EST Ventricular Rate:  60 PR Interval:  142 QRS Duration:  74 QT Interval:  380 QTC Calculation: 380 R Axis:   -42  Text Interpretation: Normal sinus rhythm Left axis deviation Low voltage QRS When compared with ECG of 13-Apr-2023 11:44, No significant change was found Confirmed by Jong Rickman [phone]) on 03/27/2024 3:59:02 PM    Recent Labs: No results found for requested labs within last 365 days.  Recent Lipid Panel    Component Value Date/Time   CHOL 110 04/14/2022 0827   TRIG 153 (H) 04/14/2022 0827   HDL 52 04/14/2022 0827   CHOLHDL 2.1 04/14/2022 0827   CHOLHDL 5.2 12/28/2021 0700   VLDL 27 12/28/2021 0700   LDLCALC 32 04/14/2022 0827   Dated 10/12/22: cholesterol 124, triglycerides 190, LDL 45, HDL 48, A1c 5.7%. chemistries normal  Risk Assessment/Calculations:    CHA2DS2-VASc Score = 4   This indicates a 4.8% annual risk of stroke. The patient's score is based upon: CHF History: 0 HTN History: 1 Diabetes History: 0 Stroke History: 0 Vascular Disease History: 1 Age Score: 2 Gender Score: 0          Physical Exam:    VS:  BP 130/82   Pulse 60   Ht 5' 6 (1.676 m)   Wt 185 lb 3.2 oz (84 kg)   SpO2 95%   BMI 29.89 kg/m     Wt Readings from Last 3 Encounters:  03/27/24 185 lb 3.2 oz (84 kg)  04/13/23 182 lb (82.6 kg)  10/18/22 181 lb 3.2 oz (82.2 kg)     GEN:  Well nourished, well developed in no acute distress HEENT: Normal NECK: No JVD; No carotid bruits LYMPHATICS: No lymphadenopathy CARDIAC: RRR, no murmurs, rubs, gallops RESPIRATORY:  Clear to auscultation without rales, wheezing or rhonchi  ABDOMEN: Soft, non-tender, non-distended MUSCULOSKELETAL:  No edema; No  deformity  SKIN: Warm and dry NEUROLOGIC:  Alert and oriented x 3 PSYCHIATRIC:   Normal affect  Right radial cath site C/D/I  ASSESSMENT:    1. Coronary artery disease of native artery of native heart with stable angina pectoris   2. Hypercholesterolemia   3. Hyperlipidemia LDL goal <70   4. PAF (paroxysmal atrial fibrillation) (HCC)      PLAN:    In order of problems listed above:  CAD NSTEMI DES-LAD 12/29/21 Nonobstructive disease in LCx treated medically. FFR OK. Stressed risk factor modification.  No antiplatelet therapy since on Eliquis  No beta-blocker  given bradycardia He has no significant angina On amlodipine   PAF with RVR Sinus bradycardia Maintaining NSR Continue Eliquis   Need for anticoagulation CHA2DS2-VASc of 4 Continue Eliquis , no bleeding issues  Hyperlipidemia with LDL goal < 70 Last LDL in July 51  LP (a) 18.6 Statin intolerant- Now on  Repatha .    HTN BP well controlled on lisinopril  and amlodipine        Follow up in one year    Medication Adjustments/Labs and Tests Ordered: Current medicines are reviewed at length with the patient today.  Concerns regarding medicines are outlined above.  Orders Placed This Encounter  Procedures   EKG 12-Lead   No orders of the defined types were placed in this encounter.   Patient Instructions  Medication Instructions:   *If you need a refill on your cardiac medications before your next appointment, please call your pharmacy*  Lab Work:   Testing/Procedures:   Follow-Up: At [facility], you and your health needs are our priority.  As part of our continuing mission to provide you with exceptional heart care, our providers are all part of one team.  This team includes your primary Cardiologist (physician) and Advanced Practice Providers or APPs (Physician Assistants and Nurse Practitioners) who all work together to provide you with the care you need, when you need it.  Your next appointment:      Provider:      We recommend signing up for the patient portal  called MyChart.  Sign up information is provided on this After Visit Summary.  MyChart is used to connect with patients for Virtual Visits (Telemedicine).  Patients are able to view lab/test results, encounter notes, upcoming appointments, etc.  Non-urgent messages can be sent to your provider as well.   To learn more about what you can do with MyChart, go to [email or web address].     Signed, Charnee Turnipseed, MD  03/27/2024 3:59 PM    [facility] "

## 2024-03-27 ENCOUNTER — Ambulatory Visit: Admitting: Cardiology

## 2024-03-27 ENCOUNTER — Encounter: Payer: Self-pay | Admitting: Cardiology

## 2024-03-27 VITALS — BP 130/82 | HR 60 | Ht 66.0 in | Wt 185.2 lb

## 2024-03-27 DIAGNOSIS — I480 Paroxysmal atrial fibrillation: Secondary | ICD-10-CM | POA: Diagnosis not present

## 2024-03-27 DIAGNOSIS — I25118 Atherosclerotic heart disease of native coronary artery with other forms of angina pectoris: Secondary | ICD-10-CM | POA: Insufficient documentation

## 2024-03-27 DIAGNOSIS — E785 Hyperlipidemia, unspecified: Secondary | ICD-10-CM | POA: Diagnosis not present

## 2024-03-27 DIAGNOSIS — E7800 Pure hypercholesterolemia, unspecified: Secondary | ICD-10-CM | POA: Insufficient documentation

## 2024-03-27 NOTE — Patient Instructions (Addendum)

## 2024-04-15 ENCOUNTER — Other Ambulatory Visit: Payer: Self-pay | Admitting: Cardiology

## 2024-04-22 ENCOUNTER — Other Ambulatory Visit: Payer: Self-pay | Admitting: Cardiology

## 2024-04-25 NOTE — Telephone Encounter (Signed)
 In accordance with refill protocols, please review and address the following requirements before this medication refill can be authorized:  Labs

## 2024-06-26 ENCOUNTER — Other Ambulatory Visit
